# Patient Record
Sex: Female | Born: 1955
Health system: Southern US, Community
[De-identification: ages and names within clinical notes are randomized; demographics above are authoritative.]

## PROBLEM LIST (undated history)

## (undated) DIAGNOSIS — K5090 Crohn's disease, unspecified, without complications: Secondary | ICD-10-CM

## (undated) DIAGNOSIS — D649 Anemia, unspecified: Secondary | ICD-10-CM

## (undated) DIAGNOSIS — C4491 Basal cell carcinoma of skin, unspecified: Secondary | ICD-10-CM

## (undated) DIAGNOSIS — E785 Hyperlipidemia, unspecified: Secondary | ICD-10-CM

## (undated) DIAGNOSIS — I350 Nonrheumatic aortic (valve) stenosis: Secondary | ICD-10-CM

## (undated) DIAGNOSIS — R011 Cardiac murmur, unspecified: Secondary | ICD-10-CM

## (undated) DIAGNOSIS — E7800 Pure hypercholesterolemia, unspecified: Secondary | ICD-10-CM

## (undated) DIAGNOSIS — K529 Noninfective gastroenteritis and colitis, unspecified: Secondary | ICD-10-CM

## (undated) DIAGNOSIS — I10 Essential (primary) hypertension: Secondary | ICD-10-CM

## (undated) HISTORY — PX: TUBAL LIGATION: SHX77

## (undated) HISTORY — DX: Crohn's disease, unspecified, without complications: K50.90

## (undated) HISTORY — DX: Anemia, unspecified: D64.9

## (undated) HISTORY — DX: Cardiac murmur, unspecified: R01.1

## (undated) HISTORY — DX: Hyperlipidemia, unspecified: E78.5

## (undated) HISTORY — DX: Pure hypercholesterolemia, unspecified: E78.00

## (undated) HISTORY — DX: Nonrheumatic aortic (valve) stenosis: I35.0

## (undated) HISTORY — DX: Basal cell carcinoma of skin, unspecified: C44.91

## (undated) HISTORY — PX: OTHER SURGICAL HISTORY: SHX169

## (undated) HISTORY — DX: Noninfective gastroenteritis and colitis, unspecified: K52.9

## (undated) HISTORY — DX: Essential (primary) hypertension: I10

---

## 1981-02-28 ENCOUNTER — Encounter (INDEPENDENT_AMBULATORY_CARE_PROVIDER_SITE_OTHER): Payer: Self-pay | Admitting: *Deleted

## 1984-11-30 ENCOUNTER — Encounter (INDEPENDENT_AMBULATORY_CARE_PROVIDER_SITE_OTHER): Payer: Self-pay | Admitting: *Deleted

## 1986-01-12 ENCOUNTER — Encounter (INDEPENDENT_AMBULATORY_CARE_PROVIDER_SITE_OTHER): Payer: Self-pay | Admitting: *Deleted

## 1987-12-26 ENCOUNTER — Encounter (INDEPENDENT_AMBULATORY_CARE_PROVIDER_SITE_OTHER): Payer: Self-pay | Admitting: *Deleted

## 1990-01-06 ENCOUNTER — Encounter (INDEPENDENT_AMBULATORY_CARE_PROVIDER_SITE_OTHER): Payer: Self-pay | Admitting: *Deleted

## 1990-12-28 ENCOUNTER — Encounter (INDEPENDENT_AMBULATORY_CARE_PROVIDER_SITE_OTHER): Payer: Self-pay | Admitting: *Deleted

## 1992-12-12 ENCOUNTER — Encounter (INDEPENDENT_AMBULATORY_CARE_PROVIDER_SITE_OTHER): Payer: Self-pay | Admitting: *Deleted

## 1996-12-26 ENCOUNTER — Encounter (INDEPENDENT_AMBULATORY_CARE_PROVIDER_SITE_OTHER): Payer: Self-pay | Admitting: *Deleted

## 1998-02-04 ENCOUNTER — Ambulatory Visit: Admission: RE | Admit: 1998-02-04 | Discharge: 1998-02-04 | Payer: Self-pay | Admitting: Cardiology

## 1998-10-24 ENCOUNTER — Other Ambulatory Visit: Admission: RE | Admit: 1998-10-24 | Discharge: 1998-10-24 | Payer: Self-pay | Admitting: Obstetrics and Gynecology

## 1999-06-19 ENCOUNTER — Encounter (INDEPENDENT_AMBULATORY_CARE_PROVIDER_SITE_OTHER): Payer: Self-pay | Admitting: *Deleted

## 1999-06-19 ENCOUNTER — Encounter (INDEPENDENT_AMBULATORY_CARE_PROVIDER_SITE_OTHER): Payer: Self-pay | Admitting: Specialist

## 1999-06-19 ENCOUNTER — Other Ambulatory Visit: Admission: RE | Admit: 1999-06-19 | Discharge: 1999-06-19 | Payer: Self-pay | Admitting: Gastroenterology

## 1999-11-14 ENCOUNTER — Other Ambulatory Visit: Admission: RE | Admit: 1999-11-14 | Discharge: 1999-11-14 | Payer: Self-pay | Admitting: Obstetrics and Gynecology

## 2000-11-17 ENCOUNTER — Other Ambulatory Visit: Admission: RE | Admit: 2000-11-17 | Discharge: 2000-11-17 | Payer: Self-pay | Admitting: Obstetrics and Gynecology

## 2001-06-23 ENCOUNTER — Encounter (INDEPENDENT_AMBULATORY_CARE_PROVIDER_SITE_OTHER): Payer: Self-pay | Admitting: *Deleted

## 2001-09-20 ENCOUNTER — Encounter: Payer: Self-pay | Admitting: Internal Medicine

## 2001-12-01 ENCOUNTER — Other Ambulatory Visit: Admission: RE | Admit: 2001-12-01 | Discharge: 2001-12-01 | Payer: Self-pay | Admitting: Obstetrics and Gynecology

## 2002-01-25 ENCOUNTER — Ambulatory Visit (HOSPITAL_BASED_OUTPATIENT_CLINIC_OR_DEPARTMENT_OTHER): Admission: RE | Admit: 2002-01-25 | Discharge: 2002-01-25 | Payer: Self-pay | Admitting: Plastic Surgery

## 2002-12-07 ENCOUNTER — Other Ambulatory Visit: Admission: RE | Admit: 2002-12-07 | Discharge: 2002-12-07 | Payer: Self-pay | Admitting: Obstetrics and Gynecology

## 2003-05-22 ENCOUNTER — Encounter (INDEPENDENT_AMBULATORY_CARE_PROVIDER_SITE_OTHER): Payer: Self-pay | Admitting: *Deleted

## 2004-04-30 ENCOUNTER — Ambulatory Visit: Payer: Self-pay | Admitting: Gastroenterology

## 2004-05-26 ENCOUNTER — Ambulatory Visit: Payer: Self-pay | Admitting: Cardiology

## 2004-06-11 ENCOUNTER — Ambulatory Visit: Payer: Self-pay

## 2005-05-13 ENCOUNTER — Ambulatory Visit: Payer: Self-pay | Admitting: Gastroenterology

## 2005-06-01 ENCOUNTER — Ambulatory Visit: Payer: Self-pay | Admitting: Cardiology

## 2005-06-25 ENCOUNTER — Encounter (INDEPENDENT_AMBULATORY_CARE_PROVIDER_SITE_OTHER): Payer: Self-pay | Admitting: *Deleted

## 2005-06-25 ENCOUNTER — Encounter: Payer: Self-pay | Admitting: Internal Medicine

## 2005-06-25 ENCOUNTER — Ambulatory Visit: Payer: Self-pay | Admitting: Gastroenterology

## 2006-04-21 ENCOUNTER — Ambulatory Visit: Payer: Self-pay | Admitting: Gastroenterology

## 2006-06-11 ENCOUNTER — Encounter: Payer: Self-pay | Admitting: Cardiology

## 2006-06-11 ENCOUNTER — Ambulatory Visit: Payer: Self-pay

## 2006-06-15 HISTORY — PX: AORTIC VALVE REPLACEMENT: SHX41

## 2006-06-29 ENCOUNTER — Ambulatory Visit: Payer: Self-pay | Admitting: Cardiovascular Disease

## 2006-07-02 ENCOUNTER — Ambulatory Visit: Payer: Self-pay

## 2006-07-27 ENCOUNTER — Ambulatory Visit: Payer: Self-pay | Admitting: Cardiovascular Disease

## 2006-11-23 ENCOUNTER — Encounter: Payer: Self-pay | Admitting: Cardiovascular Disease

## 2006-11-23 ENCOUNTER — Ambulatory Visit: Payer: Self-pay

## 2006-11-23 ENCOUNTER — Ambulatory Visit: Payer: Self-pay | Admitting: Cardiovascular Disease

## 2006-11-23 LAB — CONVERTED CEMR LAB
Basophils Relative: 0 % (ref 0.0–1.0)
CO2: 28 meq/L (ref 19–32)
Chloride: 105 meq/L (ref 96–112)
Creatinine, Ser: 0.8 mg/dL (ref 0.4–1.2)
Eosinophils Relative: 1.6 % (ref 0.0–5.0)
Glucose, Bld: 79 mg/dL (ref 70–99)
HCT: 37.7 % (ref 36.0–46.0)
INR: 1 (ref 0.9–2.0)
Neutrophils Relative %: 77.2 % — ABNORMAL HIGH (ref 43.0–77.0)
RBC: 4.51 M/uL (ref 3.87–5.11)
RDW: 12.9 % (ref 11.5–14.6)
Sodium: 139 meq/L (ref 135–145)
WBC: 11.6 10*3/uL — ABNORMAL HIGH (ref 4.5–10.5)
aPTT: 25.1 s — ABNORMAL LOW (ref 26.5–36.5)

## 2006-11-25 ENCOUNTER — Ambulatory Visit (HOSPITAL_COMMUNITY): Admission: RE | Admit: 2006-11-25 | Discharge: 2006-11-25 | Payer: Self-pay | Admitting: Cardiovascular Disease

## 2006-11-25 ENCOUNTER — Ambulatory Visit: Payer: Self-pay | Admitting: Cardiovascular Disease

## 2006-12-07 ENCOUNTER — Ambulatory Visit: Payer: Self-pay | Admitting: Surgery

## 2006-12-15 ENCOUNTER — Encounter: Payer: Self-pay | Admitting: Surgery

## 2006-12-15 ENCOUNTER — Ambulatory Visit: Payer: Self-pay | Admitting: Vascular Surgery

## 2006-12-16 ENCOUNTER — Ambulatory Visit: Admission: RE | Admit: 2006-12-16 | Discharge: 2006-12-16 | Payer: Self-pay | Admitting: Surgery

## 2006-12-20 ENCOUNTER — Inpatient Hospital Stay (HOSPITAL_COMMUNITY): Admission: AD | Admit: 2006-12-20 | Discharge: 2006-12-25 | Payer: Self-pay | Admitting: Surgery

## 2006-12-20 ENCOUNTER — Ambulatory Visit: Payer: Self-pay | Admitting: Surgery

## 2006-12-20 ENCOUNTER — Encounter: Payer: Self-pay | Admitting: Surgery

## 2006-12-27 ENCOUNTER — Ambulatory Visit: Payer: Self-pay | Admitting: Surgery

## 2007-01-07 ENCOUNTER — Ambulatory Visit: Payer: Self-pay | Admitting: Cardiovascular Disease

## 2007-01-13 ENCOUNTER — Encounter (HOSPITAL_COMMUNITY): Admission: RE | Admit: 2007-01-13 | Discharge: 2007-04-13 | Payer: Self-pay | Admitting: Cardiovascular Disease

## 2007-02-01 ENCOUNTER — Encounter: Payer: Self-pay | Admitting: Cardiovascular Disease

## 2007-02-01 ENCOUNTER — Ambulatory Visit: Payer: Self-pay

## 2007-02-08 ENCOUNTER — Encounter: Admission: RE | Admit: 2007-02-08 | Discharge: 2007-02-08 | Payer: Self-pay | Admitting: Surgery

## 2007-02-08 ENCOUNTER — Ambulatory Visit: Payer: Self-pay | Admitting: Surgery

## 2007-04-14 ENCOUNTER — Encounter (HOSPITAL_COMMUNITY): Admission: RE | Admit: 2007-04-14 | Discharge: 2007-04-22 | Payer: Self-pay | Admitting: Cardiovascular Disease

## 2007-04-19 ENCOUNTER — Ambulatory Visit: Payer: Self-pay | Admitting: Cardiovascular Disease

## 2007-04-28 ENCOUNTER — Ambulatory Visit: Payer: Self-pay | Admitting: Internal Medicine

## 2007-07-14 ENCOUNTER — Ambulatory Visit: Payer: Self-pay | Admitting: Internal Medicine

## 2007-07-28 ENCOUNTER — Ambulatory Visit: Payer: Self-pay | Admitting: Internal Medicine

## 2007-07-28 ENCOUNTER — Encounter: Payer: Self-pay | Admitting: Internal Medicine

## 2007-07-28 LAB — CONVERTED CEMR LAB
Alkaline Phosphatase: 44 units/L (ref 39–117)
BUN: 6 mg/dL (ref 6–23)
Basophils Relative: 1 % (ref 0.0–1.0)
Bilirubin, Direct: 0.1 mg/dL (ref 0.0–0.3)
CO2: 27 meq/L (ref 19–32)
CRP, High Sensitivity: 17 — ABNORMAL HIGH (ref 0.00–5.00)
GFR calc Af Amer: 85 mL/min
Glucose, Bld: 84 mg/dL (ref 70–99)
Hemoglobin: 11.9 g/dL — ABNORMAL LOW (ref 12.0–15.0)
Lymphocytes Relative: 19.7 % (ref 12.0–46.0)
Monocytes Absolute: 0.8 10*3/uL — ABNORMAL HIGH (ref 0.2–0.7)
Monocytes Relative: 11.4 % — ABNORMAL HIGH (ref 3.0–11.0)
Neutro Abs: 4.3 10*3/uL (ref 1.4–7.7)
Potassium: 4.1 meq/L (ref 3.5–5.1)
Sed Rate: 29 mm/hr — ABNORMAL HIGH (ref 0–25)
Total Protein: 6.5 g/dL (ref 6.0–8.3)

## 2007-08-04 DIAGNOSIS — I359 Nonrheumatic aortic valve disorder, unspecified: Secondary | ICD-10-CM | POA: Insufficient documentation

## 2007-08-04 DIAGNOSIS — K5090 Crohn's disease, unspecified, without complications: Secondary | ICD-10-CM | POA: Insufficient documentation

## 2007-08-04 DIAGNOSIS — E785 Hyperlipidemia, unspecified: Secondary | ICD-10-CM | POA: Insufficient documentation

## 2007-08-24 ENCOUNTER — Ambulatory Visit: Payer: Self-pay | Admitting: Internal Medicine

## 2007-10-05 ENCOUNTER — Ambulatory Visit: Payer: Self-pay | Admitting: Internal Medicine

## 2007-11-29 ENCOUNTER — Ambulatory Visit: Payer: Self-pay | Admitting: Internal Medicine

## 2007-11-29 LAB — CONVERTED CEMR LAB
AST: 22 units/L (ref 0–37)
Albumin: 4.1 g/dL (ref 3.5–5.2)
Alkaline Phosphatase: 40 units/L (ref 39–117)
BUN: 10 mg/dL (ref 6–23)
Bilirubin, Direct: 0.1 mg/dL (ref 0.0–0.3)
Chloride: 103 meq/L (ref 96–112)
Eosinophils Relative: 0.7 % (ref 0.0–5.0)
Glucose, Bld: 107 mg/dL — ABNORMAL HIGH (ref 70–99)
Lymphocytes Relative: 26.6 % (ref 12.0–46.0)
Monocytes Relative: 8.2 % (ref 3.0–12.0)
Neutrophils Relative %: 63.6 % (ref 43.0–77.0)
Platelets: 240 10*3/uL (ref 150–400)
Potassium: 4.1 meq/L (ref 3.5–5.1)
Total Protein: 6.9 g/dL (ref 6.0–8.3)
WBC: 8.1 10*3/uL (ref 4.5–10.5)

## 2007-12-07 ENCOUNTER — Ambulatory Visit: Payer: Self-pay | Admitting: Internal Medicine

## 2007-12-28 ENCOUNTER — Ambulatory Visit: Payer: Self-pay | Admitting: Cardiovascular Disease

## 2008-06-04 ENCOUNTER — Ambulatory Visit: Payer: Self-pay | Admitting: Internal Medicine

## 2008-06-04 LAB — CONVERTED CEMR LAB
ALT: 17 units/L (ref 0–35)
AST: 23 units/L (ref 0–37)
Alkaline Phosphatase: 44 units/L (ref 39–117)
BUN: 9 mg/dL (ref 6–23)
Basophils Absolute: 0 10*3/uL (ref 0.0–0.1)
Basophils Relative: 0.5 % (ref 0.0–3.0)
CRP, High Sensitivity: 17 — ABNORMAL HIGH (ref 0.00–5.00)
Calcium: 9.1 mg/dL (ref 8.4–10.5)
Chloride: 104 meq/L (ref 96–112)
Creatinine, Ser: 0.7 mg/dL (ref 0.4–1.2)
Eosinophils Absolute: 0.1 10*3/uL (ref 0.0–0.7)
HCT: 36 % (ref 36.0–46.0)
MCHC: 34.6 g/dL (ref 30.0–36.0)
MCV: 103.5 fL — ABNORMAL HIGH (ref 78.0–100.0)
Monocytes Absolute: 0.9 10*3/uL (ref 0.1–1.0)
Neutrophils Relative %: 54 % (ref 43.0–77.0)
RBC: 3.48 M/uL — ABNORMAL LOW (ref 3.87–5.11)
Sed Rate: 24 mm/hr — ABNORMAL HIGH (ref 0–22)
Total Bilirubin: 0.5 mg/dL (ref 0.3–1.2)

## 2008-06-11 ENCOUNTER — Ambulatory Visit: Payer: Self-pay | Admitting: Internal Medicine

## 2008-06-11 DIAGNOSIS — D649 Anemia, unspecified: Secondary | ICD-10-CM | POA: Insufficient documentation

## 2008-06-11 LAB — CONVERTED CEMR LAB
Folate: >20 ng/mL
Vitamin B-12: 479 pg/mL (ref 211–911)

## 2008-09-08 DIAGNOSIS — Z954 Presence of other heart-valve replacement: Secondary | ICD-10-CM | POA: Insufficient documentation

## 2008-09-08 DIAGNOSIS — I10 Essential (primary) hypertension: Secondary | ICD-10-CM | POA: Insufficient documentation

## 2008-09-08 DIAGNOSIS — K5289 Other specified noninfective gastroenteritis and colitis: Secondary | ICD-10-CM | POA: Insufficient documentation

## 2008-09-08 DIAGNOSIS — E7800 Pure hypercholesterolemia, unspecified: Secondary | ICD-10-CM | POA: Insufficient documentation

## 2009-01-01 ENCOUNTER — Encounter: Payer: Self-pay | Admitting: Cardiovascular Disease

## 2009-01-01 ENCOUNTER — Ambulatory Visit: Payer: Self-pay

## 2009-01-01 ENCOUNTER — Ambulatory Visit: Payer: Self-pay | Admitting: Cardiovascular Disease

## 2009-03-20 ENCOUNTER — Telehealth: Payer: Self-pay | Admitting: Cardiovascular Disease

## 2009-05-20 ENCOUNTER — Telehealth: Payer: Self-pay | Admitting: Internal Medicine

## 2009-06-12 ENCOUNTER — Ambulatory Visit: Payer: Self-pay | Admitting: Internal Medicine

## 2009-06-20 LAB — CONVERTED CEMR LAB
Albumin: 3.7 g/dL (ref 3.5–5.2)
Basophils Relative: 0.6 % (ref 0.0–3.0)
CO2: 25 meq/L (ref 19–32)
CRP, High Sensitivity: 1.4 (ref 0.00–5.00)
Chloride: 107 meq/L (ref 96–112)
Creatinine, Ser: 0.9 mg/dL (ref 0.4–1.2)
Eosinophils Relative: 1.5 % (ref 0.0–5.0)
Lymphocytes Relative: 23.4 % (ref 12.0–46.0)
Monocytes Relative: 8.5 % (ref 3.0–12.0)
Neutrophils Relative %: 66 % (ref 43.0–77.0)
Platelets: 189 10*3/uL (ref 150.0–400.0)
RBC: 3.74 M/uL — ABNORMAL LOW (ref 3.87–5.11)
Sodium: 139 meq/L (ref 135–145)
Total Protein: 6.5 g/dL (ref 6.0–8.3)
WBC: 8 10*3/uL (ref 4.5–10.5)

## 2009-07-03 ENCOUNTER — Ambulatory Visit: Payer: Self-pay | Admitting: Internal Medicine

## 2009-07-17 ENCOUNTER — Encounter (INDEPENDENT_AMBULATORY_CARE_PROVIDER_SITE_OTHER): Payer: Self-pay | Admitting: *Deleted

## 2009-11-25 ENCOUNTER — Telehealth: Payer: Self-pay | Admitting: Cardiovascular Disease

## 2009-12-06 ENCOUNTER — Encounter (INDEPENDENT_AMBULATORY_CARE_PROVIDER_SITE_OTHER): Payer: Self-pay | Admitting: *Deleted

## 2009-12-09 ENCOUNTER — Ambulatory Visit: Payer: Self-pay | Admitting: Internal Medicine

## 2009-12-19 ENCOUNTER — Ambulatory Visit: Payer: Self-pay | Admitting: Internal Medicine

## 2009-12-20 ENCOUNTER — Encounter: Payer: Self-pay | Admitting: Internal Medicine

## 2010-01-01 ENCOUNTER — Ambulatory Visit: Payer: Self-pay | Admitting: Cardiovascular Disease

## 2010-07-06 ENCOUNTER — Encounter: Payer: Self-pay | Admitting: Surgery

## 2010-07-15 NOTE — Procedures (Signed)
Summary: Colonoscopy- [name], MD   Colonoscopy  Procedure date:  06/25/2005  Findings:      Location:  [facility].  Crohn's Disease  Procedures Next Due Date:    Colonoscopy: 06/2007 Patient Name: Brittany [name], Brittany [name]. MRN:  Procedure Procedures: Colonoscopy CPT: [phone].  Personnel: Endoscopist: Clarene Reamer, MD.  Exam Location: Exam performed in Outpatient Clinic. Outpatient  Patient Consent: Procedure, Alternatives, Risks and Benefits discussed, consent obtained, from patient. Consent was obtained by the RN.  Indications  Surveillance of: Crohn's Disease.  History  Current Medications: Patient is not currently taking Coumadin.  Pre-Exam Physical: Performed Sep 20, 2001. Entire physical exam was normal. Cardio- pulmonary exam, Rectal exam, HEENT exam , Abdominal exam, Extremity exam, Neurological exam, Mental status exam WNL.  Comments: Pt. history reviewed/updated, physical exam performed prior to initiation of sedation? Exam Exam: Extent of exam reached: Cecum, extent intended: Cecum.  The cecum was identified by appendiceal orifice and IC valve. Colon retroflexion performed. Images taken. ASA Classification: II. Tolerance: good.  Monitoring: Pulse and BP monitoring, Oximetry used. Supplemental O2 given.  Colon Prep Prep results: good.  Sedation Meds: Patient assessed and found to be appropriate for moderate (conscious) sedation. Fentanyl 125 mcg. given IV. Versed 15 given IV.  Findings - MUCOSAL ABNORMALITY: Ileum to Rectum. established. Granularity present, Activity level inactive, Endoscopic Extent of Disease: Pancolitis. Biopsy/Mucosal Abn. taken. ICD9: Crohn's Disease: 555.9. Comments: pt. with minnimal changes ; multiple bx,s obtained from r , tr. and l colon.   Assessment Abnormal examination, see findings above.  Diagnoses: 555.9: Crohn's Disease.   Events  Unplanned Interventions: No intervention was required.    Unplanned Events: There were no complications. Plans Medication Plan: Await pathology. Continue current medications.  Patient Education: Patient given standard instructions for: Crohn's. Colitis. Yearly hemoccult testing recommended. Patient instructed to get routine colonoscopy every 2 years.  Disposition: After procedure patient sent to recovery. After recovery patient sent home.   This report was created from the original endoscopy report, which was reviewed and signed by the above listed endoscopist.   cc: Burnard Bunting, MD

## 2010-07-15 NOTE — Letter (Signed)
Summary: Colonoscopy Letter  [facility]  [place], [place] [address]   Phone: [phone]  Fax: [phone]      July 17, 2009 MRN: [id number]   [place], [place]  [address]   Dear Ms. Boden,   According to your medical record, it is time for you to schedule a Colonoscopy. The American Cancer Society recommends this procedure as a method to detect early colon cancer. Patients with a family history of colon cancer, or a personal history of colon polyps or inflammatory bowel disease are at increased risk.  This letter has beeen generated based on the recommendations made at the time of your procedure. If you feel that in your particular situation this may no longer apply, please contact our office.  Please call our office at [phone] to schedule this appointment or to update your records at your earliest convenience.  Thank you for cooperating with [place] to provide you with the very best care possible.   Sincerely,  Docia Chuck. Henrene Pastor, M.D.  [facility] [phone]

## 2010-07-15 NOTE — Procedures (Signed)
Summary: [employer] officer, historic buildings   Imported By: Phillis Knack 07/05/2009 09:22:30  _____________________________________________________________________  External Attachment:    Type:   Image     Comment:   External Document

## 2010-07-15 NOTE — Procedures (Signed)
Summary: Colonoscopy/[facility]  Colonoscopy/[facility]   Imported By: Phillis Knack 07/05/2009 09:11:20  _____________________________________________________________________  External Attachment:    Type:   Image     Comment:   External Document

## 2010-07-15 NOTE — Procedures (Signed)
Summary: Melina Copa, MD   Colonoscopy  Procedure date:  05/22/2003  Findings:      Location:  [facility].  Results: Colitis.         Procedures Next Due Date:    Colonoscopy: 05/2005 Patient Name: Brittany [name], Brittany [name]. MRN:  Procedure Procedures: Colonoscopy CPT: [phone].  Personnel: Endoscopist: Clarene Reamer, MD.  Exam Location: Exam performed in Outpatient Clinic. Outpatient  Patient Consent: Procedure, Alternatives, Risks and Benefits discussed, consent obtained, from patient. Consent was obtained by the RN.  Indications  Surveillance of: Crohn's Disease.  History  Current Medications: Patient is not currently taking Coumadin.  Pre-Exam Physical: Performed Sep 20, 2001. Entire physical exam was normal. Cardio- pulmonary exam, Rectal exam, HEENT exam , Abdominal exam, Extremity exam, Neurological exam, Mental status exam WNL.  Exam Exam: Extent of exam reached: Cecum, extent intended: Cecum.  The cecum was identified by appendiceal orifice and IC valve. Colon retroflexion performed. Images were not taken. ASA Classification: II. Tolerance: good.  Monitoring: Pulse and BP monitoring, Oximetry used. Supplemental O2 given.  Colon Prep Prep results: good.  Sedation Meds: Patient assessed and found to be appropriate for moderate (conscious) sedation. Fentanyl 100 mcg. given IV. Versed 10 mg. given IV.  Findings CROHN'S: Ascending Colon. Activity level inactive, Endoscopic Extent of Disease: Pancolitis. Comments: patchy colitis very mild bx,s r, tr. l .   Assessment Abnormal examination, see findings above.  Events  Unplanned Interventions: No intervention was required.  Unplanned Events: There were no complications. Plans Medication Plan: Await pathology. Continue current medications.  Patient Education: Patient given standard instructions for: Crohn's. Yearly hemoccult testing recommended. Patient instructed to get routine  colonoscopy every 2 years.  Disposition: After procedure patient sent to recovery. After recovery patient sent home.   This report was created from the original endoscopy report, which was reviewed and signed by the above listed endoscopist.   cc:  Burnard Bunting, MD

## 2010-07-15 NOTE — Procedures (Signed)
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

## 2010-07-15 NOTE — Procedures (Signed)
Summary: Colonoscopy  Patient: [name] Note: All result statuses are Final unless otherwise noted.  Tests: (1) Colonoscopy (COL)   COL Colonoscopy           [facility]     [place], [place]  [address]           COLONOSCOPY PROCEDURE REPORT           PATIENT:  Brittany, [name]  MR#:  [id number]     BIRTHDATE:  [birth year], 17 yrs. old  GENDER:  female     ENDOSCOPIST:  Docia Chuck. Geri Seminole, MD     REF. BY:  Surveillance Program Recall     PROCEDURE DATE:  12/19/2009     PROCEDURE:  Colonoscopy with biopsies     ASA CLASS:  Class II     INDICATIONS:  surveillance and high-risk screening, Crohn's     disease (longstanding Crohn's colitis - last colonoscopy 07-2007)     MEDICATIONS:   Fentanyl 100 mcg IV, Versed 10 mg IV           DESCRIPTION OF PROCEDURE:   After the risks benefits and     alternatives of the procedure were thoroughly explained, informed     consent was obtained.  Digital rectal exam was performed and     revealed no abnormalities.   The LB [id number] endoscope     was introduced through the anus and advanced to the cecum, which     was identified by both the appendix and ileocecal valve, without     limitations.Time to cecum = 7:44 min.  The quality of the prep was     excellent, using MoviPrep.  The instrument was then slowly     withdrawn (time = 15:30 min)as the colon was fully examined.     <<PROCEDUREIMAGES>>           FINDINGS:  The terminal ileum was intubated and appeared normal.     Patchy Colitis was found in the colon, namely the ascending,     portions of the transverse, and splenic flexure. 4Q bx q10cm taken     in 4 seperate containers (n=32).  No polyps or cancers were seen.     Retroflexed views in the rectum revealed no abnormalities.    The     scope was then withdrawn from the patient and the procedure     completed.           COMPLICATIONS:  None     ENDOSCOPIC IMPRESSION:     1) Normal terminal  ileum     2) Patchy Crohn's Colitis as described     3) No polyps or cancers           RECOMMENDATIONS:     1) Follow up colonoscopy in 2 years     2) Await biopsy results     3) Continue Medication           ______________________________     Docia Chuck. Geri Seminole, MD           CC:  Burnard Bunting, MD; The Patient           n.     eSIGNED:   Docia Chuck. Geri Seminole at 12/19/2009 01:37 PM           Barney Drain, [id number]  Note: An exclamation mark (!) indicates a result that was not dispersed into  the flowsheet. Document Creation Date: 12/19/2009 1:38 PM _______________________________________________________________________  (1) Order result status: Final Collection or observation date-time: 12/19/2009 13:22 Requested date-time:  Receipt date-time:  Reported date-time:  Referring Physician:   Ordering Physician: Lavena Bullion [phone]) Specimen Source:  Source: Tawanna Cooler Order Number: [id number] Lab site:   Appended Document: Colonoscopy recall 2 yrs     Procedures Next Due Date:    Colonoscopy: 12/2011

## 2010-07-15 NOTE — Miscellaneous (Signed)
Summary: [facility]  Clinical Lists Changes  Medications: Added new medication of MOVIPREP 100 GM  SOLR (PEG-KCL-NACL-NASULF-NA ASC-C) As per prep instructions. - Signed Rx of MOVIPREP 100 GM  SOLR (PEG-KCL-NACL-NASULF-NA ASC-C) As per prep instructions.;  #1 x 0;  Signed;  Entered by: Ulice Dash RN;  Authorized by: Irene Shipper MD;  Method used: Electronically to [facility]. [phone]*, [address], [place], [place]  [address], Ph: [phone] or [phone], Fax: [phone] Observations: Added new observation of NKA: T (12/09/2009 8:19)    Prescriptions: MOVIPREP 100 GM  SOLR (PEG-KCL-NACL-NASULF-NA ASC-C) As per prep instructions.  #1 x 0   Entered by:   Ulice Dash RN   Authorized by:   Irene Shipper MD   Signed by:   Ulice Dash RN on 12/09/2009   Method used:   Electronically to        [facility] [phone]* (retail)       [address]       [place], [place]  [address]       Ph: [phone] or [phone]       Fax: [phone]   RxID:   [id number]

## 2010-07-15 NOTE — Procedures (Signed)
Summary: Colonoscopy/[facility]  Colonoscopy/[facility]   Imported By: Phillis Knack 07/05/2009 09:14:53  _____________________________________________________________________  External Attachment:    Type:   Image     Comment:   External Document

## 2010-07-15 NOTE — Procedures (Signed)
Summary: Colonoscopy/[facility]  Colonoscopy/[facility]   Imported By: Phillis Knack 07/05/2009 09:13:48  _____________________________________________________________________  External Attachment:    Type:   Image     Comment:   External Document

## 2010-07-15 NOTE — Letter (Signed)
Summary: [facility] Instructions  [facility]  [place], [place] [address]   Phone: [phone]  Fax: [phone]       MARKELLA DAO    1956-02-02    MRN: [id number]        Procedure Day /Date: Thursday 12-19-09     Arrival Time: 10:30 a.m.     Procedure Time: 11:30 a.m.     Location of Procedure:                    _x_  [place] (4th Floor)                        [place]   Starting 5 days prior to your procedure  Sat. 12-14-09 do not eat nuts, seeds, popcorn, corn, beans, peas,  salads, or any raw vegetables.  Do not take any fiber supplements (e.g. Metamucil, Citrucel, and Benefiber).  THE DAY BEFORE YOUR PROCEDURE         DATE:  12-18-09   DAY:  Wednesday  1.  Drink clear liquids the entire day-NO SOLID FOOD  2.  Do not drink anything colored red or purple.  Avoid juices with pulp.  No orange juice.  3.  Drink at least 64 oz. (8 glasses) of fluid/clear liquids during the day to prevent dehydration and help the prep work efficiently.  CLEAR LIQUIDS INCLUDE: Water Jello Ice Popsicles Tea (sugar ok, no milk/cream) Powdered fruit flavored drinks Coffee (sugar ok, no milk/cream) Gatorade Juice: apple, white grape, white cranberry  Lemonade Clear bullion, consomm, broth Carbonated beverages (any kind) Strained chicken noodle soup Hard Candy                             4.  In the morning, mix first dose of MoviPrep solution:    Empty 1 Pouch A and 1 Pouch B into the disposable container    Add lukewarm drinking water to the top line of the container. Mix to dissolve    Refrigerate (mixed solution should be used within 24 hrs)  5.  Begin drinking the prep at 5:00 p.m. The MoviPrep container is divided by 4 marks.   Every 15 minutes drink the solution down to the next mark (approximately 8 oz) until the full liter is complete.   6.  Follow completed prep with 16 oz of clear liquid of your  choice (Nothing red or purple).  Continue to drink clear liquids until bedtime.  7.  Before going to bed, mix second dose of MoviPrep solution:    Empty 1 Pouch A and 1 Pouch B into the disposable container    Add lukewarm drinking water to the top line of the container. Mix to dissolve    Refrigerate  THE DAY OF YOUR PROCEDURE      DATE:  12-19-09  DAY:  Thursday  Beginning at  6:30 a.m. (5 hours before procedure):         1. Every 15 minutes, drink the solution down to the next mark (approx 8 oz) until the full liter is complete.  2. Follow completed prep with 16 oz. of clear liquid of your choice.    3. You may drink clear liquids until  9:30 a.m.  (2 HOURS BEFORE PROCEDURE).   MEDICATION INSTRUCTIONS  Unless otherwise instructed, you should take regular prescription medications with a small sip of  water   as early as possible the morning of your procedure.           OTHER INSTRUCTIONS  You will need a responsible adult at least 54 years of age to accompany you and drive you home.   This person must remain in the waiting room during your procedure.  Wear loose fitting clothing that is easily removed.  Leave jewelry and other valuables at home.  However, you may wish to bring a book to read or  an iPod/MP3 player to listen to music as you wait for your procedure to start.  Remove all body piercing jewelry and leave at home.  Total time from sign-in until discharge is approximately 2-3 hours.  You should go home directly after your procedure and rest.  You can resume normal activities the  day after your procedure.  The day of your procedure you should not:   Drive   Make legal decisions   Operate machinery   Drink alcohol   Return to work  You will receive specific instructions about eating, activities and medications before you leave.    The above instructions have been reviewed and explained to me by   Ulice Dash RN  December 09, 2009 9:01 AM     I  fully understand and can verbalize these instructions _____________________________ Date _________

## 2010-07-15 NOTE — Assessment & Plan Note (Signed)
Summary: Crohn's colitis annual followup   History of Present Illness Visit Type: Follow-up Visit Primary GI MD: Scarlette Shorts MD Primary Provider: Burnard Bunting, MD Chief Complaint: Yearly F/U w/labs, patient states that she is not having any problems History of Present Illness:   55 year old white female with aortic stenosis status post aortic valve replacement, hyperlipidemia, hypertension, and Crohn's colitis. She presents today for ongoing management of the latter. The patient was last evaluated December 2009. Since that time she is continued on Azulfidine 2 g b.i.d. She denies any active GI symptoms. She was told followup at this time. Recent laboratories, obtained June 12, 2009 reveal a normal hemoglobin of 12.8. MCV elevated at 102.8. Normal liver function tests. Normal electrolytes. Elevated glucose at 124. Last colonoscopy several years ago revealed active inflammation. She is due for followup surveillance colonoscopy at this time. Her other chronic medical problems are stable.   GI Review of Systems      Denies abdominal pain, acid reflux, belching, bloating, chest pain, dysphagia with liquids, dysphagia with solids, heartburn, loss of appetite, nausea, vomiting, vomiting blood, weight loss, and  weight gain.        Denies anal fissure, black tarry stools, change in bowel habit, constipation, diarrhea, diverticulosis, fecal incontinence, heme positive stool, hemorrhoids, irritable bowel syndrome, jaundice, light color stool, liver problems, rectal bleeding, and  rectal pain.    Current Medications (verified): 1)  Metoprolol Tartrate 25 Mg  Tabs (Metoprolol Tartrate) .Marland Kitchen.. 1 Tablet By Mouth Two Times A Day 2)  Crestor 10 Mg  Tabs (Rosuvastatin Calcium) .Marland Kitchen.. 1 Tablet Once Daily 3)  Calcium .Marland Kitchen.. 1 Tablet Once Daily 4)  Aspirin Ec 325 Mg  Tbec (Aspirin) .Marland Kitchen.. 1 Tablet Once Daily 5)  Centrum   Tabs (Multiple Vitamins-Minerals) .Marland Kitchen.. 1 Tablet Once Daily 6)  Sulfasalazine 500 Mg Tabs  (Sulfasalazine) .... Take 4 Tablets Two Times A Day 7)  Caltrate 600+d Plus 600-400 Mg-Unit Chew (Calcium Carbonate-Vit D-Min) .... Take 2 Tablets Daily  Allergies (verified): No Known Drug Allergies  Past History:  Past Medical History: Reviewed history from 09/08/2008 and no changes required. HYPERCHOLESTEROLEMIA  IIA (ICD-272.0) COLITIS (ICD-558.9) HYPERTENSION, UNSPECIFIED (ICD-401.9) ANEMIA-UNSPECIFIED (ICD-285.9) AORTIC STENOSIS (ICD-424.1) CROHN'S DISEASE (ICD-555.9) DYSLIPIDEMIA (ICD-272.4)  Past Surgical History: Reviewed history from 08/04/2007 and no changes required. Aortic Valve Replacement:  tubal ligation  Family History: Reviewed history from 12/07/2007 and no changes required. Family History of Breast Cancer:Paternal Aunts No FH of Colon Cancer: Family History of Prostate Cancer:Father Family History of Colitis/Crohn's:Sister Family History of Diabetes: Paternal Grandfather Family History of Heart Disease: Paternal Grandfather  Social History: Reviewed history from 12/07/2007 and no changes required. 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  Alcohol Use - yes 3 per week Daily Caffeine YWV:3-7 times weekly Illicit Drug Use - no Patient gets regular exercise.  Review of Systems       non-GI review of systems is entirely negative  Vital Signs:  Patient profile:   55 year old female Height:      64 inches Weight:      142.13 pounds BMI:     24.48 Pulse rate:   72 / minute Pulse rhythm:   regular BP sitting:   124 / 86  (left arm) Cuff size:   regular  Vitals Entered By: June McMurray Halfway Deborra Medina) (July 03, 2009 3:39 PM)  Physical Exam  General:  Well developed, well nourished, no acute distress. Head:  Normocephalic and atraumatic. Eyes:  PERRLA, no icterus. Ears:  Normal auditory  acuity. Nose:  No deformity, discharge,  or lesions. Mouth:  No deformity or lesions, dentition normal. Neck:  Supple; no masses or thyromegaly. Chest Wall:   Symmetrical;  no deformities or tenderness. Lungs:  Clear throughout to auscultation. Heart:  Regular rate and rhythm; no murmurs, rubs,  or bruits. Abdomen:  Soft, nontender and nondistended. No masses, hepatosplenomegaly or hernias noted. Normal bowel sounds. Rectal:  deferred Msk:  Symmetrical with no gross deformities. Normal posture. Pulses:  Normal pulses noted. Extremities:  No clubbing, cyanosis, edema or deformities noted. Neurologic:  Alert and  oriented x4;   Skin:  Intact without significant lesions or rashes. Psych:  Alert and cooperative. Normal mood and affect.   Impression & Recommendations:  Problem # 1:  CROHN'S DISEASE (ICD-555.9) Crohn's colitis. Last surveillance colonoscopy in 2009. Clinically asymptomatic on Azulfidine. Recent blood work unremarkable. Due for surveillance colonoscopy.  Plan: #1. Continue Azulfidine 2 g b.i.d. A prescription with multiple refills have been submitted #2. Colonoscopy with biopsies. The nature of the procedure as well as risks, benefits, and alternatives were reviewed. She understood and agreed to proceed. The patient needs to attend to her husband's health needs. He will contact the office in the next few months to set up her colonoscopy. She can see a pre-visit nurse directly. Movi prep will be prescribed. #3. After colonoscopy, routine office followup would be planned in about one year  Patient Instructions: 1)  copy: Dr. Burnard Bunting

## 2010-07-15 NOTE — Procedures (Signed)
Summary: Colonoscopy/[facility]  Colonoscopy/[facility]   Imported By: Phillis Knack 07/05/2009 09:16:34  _____________________________________________________________________  External Attachment:    Type:   Image     Comment:   External Document

## 2010-07-15 NOTE — Assessment & Plan Note (Signed)
Summary: F1Y/DM   Primary Provider:  Burnard Bunting, MD  CC:  no complaints.  History of Present Illness: Brittany [name] is seen today in followup for her aortic valve replacement.  By her relatively young age he chose a tissue valve.  Her surgery was done in 2008. Echo 7/10 showed a mean gradient of 18 mmHg and peak of 32 mmHg.  There was no periprosthetic leak.  She is active with no SOB, palpitatoins chest pain or edema.  Has chrones disease followed by Dr Henrene Pastor.  Did not get SBE prophylaxis with las colonoscopy.  Will have to review AHA 2008 guidlines given her tissue AVR.    Current Problems (verified): 1)  Aortic Valve Replacement, Hx of  (ICD-V43.3) 2)  Hypercholesterolemia Iia  (ICD-272.0) 3)  Colitis  (ICD-558.9) 4)  Hypertension, Unspecified  (ICD-401.9) 5)  Anemia-unspecified  (ICD-285.9) 6)  Aortic Stenosis  (ICD-424.1) 7)  Crohn's Disease  (ICD-555.9) 8)  Dyslipidemia  (ICD-272.4)  Current Medications (verified): 1)  Metoprolol Tartrate 25 Mg  Tabs (Metoprolol Tartrate) .Marland Kitchen.. 1 Tablet By Mouth Two Times A Day 2)  Crestor 10 Mg  Tabs (Rosuvastatin Calcium) .Marland Kitchen.. 1 Tablet Once Daily 3)  Calcium .Marland Kitchen.. 1 Tablet Once Daily 4)  Aspirin Ec 325 Mg  Tbec (Aspirin) .Marland Kitchen.. 1 Tablet Once Daily 5)  Centrum   Tabs (Multiple Vitamins-Minerals) .Marland Kitchen.. 1 Tablet Once Daily 6)  Sulfasalazine 500 Mg Tabs (Sulfasalazine) .... Take 4 Tablets Two Times A Day 7)  Caltrate 600+d Plus 600-400 Mg-Unit Chew (Calcium Carbonate-Vit D-Min) .... Take 2 Tablets Daily  Allergies (verified): No Known Drug Allergies  Past History:  Past Medical History: Last updated: 09/08/2008 HYPERCHOLESTEROLEMIA  IIA (ICD-272.0) COLITIS (ICD-558.9) HYPERTENSION, UNSPECIFIED (ICD-401.9) ANEMIA-UNSPECIFIED (ICD-285.9) AORTIC STENOSIS (ICD-424.1) CROHN'S DISEASE (ICD-555.9) DYSLIPIDEMIA (ICD-272.4)  Past Surgical History: Last updated: 08/04/2007 Aortic Valve Replacement:  tubal ligation  Family History: Last updated:  12/07/2007 Family History of Breast Cancer:Paternal Aunts No FH of Colon Cancer: Family History of Prostate Cancer:Father Family History of Colitis/Crohn's:Sister Family History of Diabetes: Paternal Grandfather Family History of Heart Disease: Paternal Grandfather  Social History: Last updated: 12/07/2007 Occupation: Engineer, 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  Alcohol Use - yes 3 per week Daily Caffeine KVQ:2-5 times weekly Illicit Drug Use - no Patient gets regular exercise.  Review of Systems       Denies fever, malais, weight loss, blurry vision, decreased visual acuity, cough, sputum, SOB, hemoptysis, pleuritic pain, palpitaitons, heartburn, abdominal pain, melena, lower extremity edema, claudication, or rash.   Vital Signs:  Patient profile:   55 year old female Height:      64 inches Weight:      144 pounds Pulse rate:   80 / minute Pulse rhythm:   regular BP sitting:   104 / 70  (left arm) Cuff size:   regular  Vitals Entered By: Devra Dopp, LPN (January 01, 9562 8:75 PM)  Physical Exam  General:  Affect appropriate Healthy:  appears stated age 75: normal Neck supple with no adenopathy JVP normal no bruits no thyromegaly Lungs clear with no wheezing and good diaphragmatic motion Heart:  I4/P3 systolic  murmur,rub, gallop or click PMI normal Abdomen: benighn, BS positve, no tenderness, no AAA no bruit.  No HSM or HJR Distal pulses intact with no bruits No edema Neuro non-focal Skin warm and dry    Impression & Recommendations:  Problem # 1:  AORTIC VALVE REPLACEMENT, HX OF (ICD-V43.3) Functioning normally.  Will have to research SBE indications for GI procedures.  Discussed with Dr Aundra Dubin and Ron Parker and they are not sure  either.  Quick review of AHA guidelines indicate not needed for colonoscopy only dental procedures  Problem # 2:  HYPERCHOLESTEROLEMIA  IIA (ICD-272.0) F/U labs with primary in 6 months Her updated medication list for this problem includes:     Crestor 10 Mg Tabs (Rosuvastatin calcium) .Marland Kitchen... 1 tablet once daily  CRP: 1.40 mg/L (06/12/2009)     Problem # 3:  HYPERTENSION, UNSPECIFIED (ICD-401.9) Well controlled Her updated medication list for this problem includes:    Metoprolol Tartrate 25 Mg Tabs (Metoprolol tartrate) .Marland Kitchen... 1 tablet by mouth two times a day    Aspirin Ec 325 Mg Tbec (Aspirin) .Marland Kitchen... 1 tablet once daily  Problem # 4:  COLITIS (ICD-558.9) F/U Dr Henrene Pastor.  NO abdominal pain or bloody stool  Patient Instructions: 1)  Your physician recommends that you schedule a follow-up appointment in: [place] 2)  Your physician recommends that you continue on your current medications as directed. Please refer to the Current Medication list given to you today.

## 2010-07-15 NOTE — Progress Notes (Signed)
Summary: dose pt need echo  Phone Note Call from Patient Call back at Work Phone [phone]   Caller: Patient Reason for Call: Talk to Nurse, Talk to Doctor Summary of Call: pt was wndering did she need to schedule an echo for this year if so please call and le ther know at work Initial call taken by: Shelda Pal,  November 25, 2009 9:20 AM  Follow-up for Phone Call        spoke with pt, she has had echo's the last two years for her valve. she is not scheduled this year and wanted to make sure she does not need one. she is having no problems. will discuss with dr Nelida Meuse, RN  November 25, 2009 12:46 PM  discussed with dr Johnsie Cancel, pt does not need echo prior to appt. pt aware Fredia Beets, RN  November 25, 2009 5:22 PM

## 2010-07-15 NOTE — Letter (Signed)
Summary: Patient Notice- Colon Biospy Results  [facility]  [address] [place], [place] [address]   Phone: [phone]  Fax: [phone]        December 20, 2009 MRN: [id number]    Brittany [name] [address] [place], [place]  [address]    Dear [name],  I am pleased to inform you that the biopsies taken during your recent colonoscopy did not show any evidence of dysplasia upon pathologic examination. There were changes of mild patchy colitis, consistent with what was observed endoscopically.    Additional information/recommendations:   __No further action is needed at this time.  Please follow-up with      your primary care physician for your other healthcare needs.   __You should have a repeat colonoscopy examination for this problem           in 2 years.    Please call us if you are having persistent problems or have questions about your condition that have not been fully answered at this time.  Sincerely,  Irene Shipper MD   This letter has been electronically signed by your physician.  Appended Document: Patient Notice- Colon Biospy Results letter mailed .

## 2010-07-15 NOTE — Procedures (Signed)
Summary: Colonoscopy/[facility]  Colonoscopy/[facility]   Imported By: Phillis Knack 07/05/2009 09:19:59  _____________________________________________________________________  External Attachment:    Type:   Image     Comment:   External Document

## 2010-07-15 NOTE — Procedures (Signed)
Summary: Colonoscopy/[facility]  Colonoscopy/[facility]   Imported By: Phillis Knack 07/05/2009 09:18:32  _____________________________________________________________________  External Attachment:    Type:   Image     Comment:   External Document

## 2010-07-15 NOTE — Procedures (Signed)
Summary: Melina Copa, MD   Colonoscopy  Procedure date:  06/23/2001  Findings:      Location:  [place].  Results: Colitis.         Procedures Next Due Date:    Colonoscopy: 06/2003  Colonoscopy  Procedure date:  06/23/2001  Findings:      Location:  [place].  Results: Colitis.         Procedures Next Due Date:    Colonoscopy: 06/2003 Patient Name: Brittany [name], Brittany [name]. MRN:  Procedure Procedures: Colonoscopy CPT: [phone].    with multiple biopsies. CPT: [phone].  Personnel: Endoscopist: Clarene Reamer, MD.  Referred By: Nanine Means [name], MD.  Exam Location: Exam performed in Outpatient Clinic. Outpatient  Patient Consent: Procedure, Alternatives, Risks and Benefits discussed, consent obtained, from patient.  Indications  Surveillance of: Ulcerative Colitis.  History  Pre-Exam Physical: Cardio-pulmonary exam, Rectal exam, HEENT exam , Abdominal exam, Extremity exam, Mental status exam WNL.  Exam Exam: Extent of exam reached: Cecum, extent intended: Cecum.  The cecum was identified by appendiceal orifice and IC valve. ASA Classification: II. Tolerance: good.  Monitoring: Pulse and BP monitoring, Oximetry used. Supplemental O2 given.  Colon Prep Prep results: fair, exam compromised.  Sedation Meds: Patient assessed and found to be appropriate for moderate (conscious) sedation. Fentanyl 100 mcg. given IV. Versed 12 given IV.  Findings MUCOSAL ABNORMALITY: Transverse Colon. Biopsy/Mucosal Abn. taken. Comments: 27m thickened mucosa bx .  - MUCOSAL ABNORMALITY: Cecum to Sigmoid Colon. taken. ICD9: Colitis, Pancolitic, Universal: 556.4. Comments: colitis in remission.  - HEMORRHOIDS: Internal and External. Size: Grade I.   Assessment Abnormal examination, see findings above.  Diagnoses: 556.4: Colitis, Pancolitic, Universal. in remission.   Events  Unplanned Interventions: No intervention was  required.  Unplanned Events: There were no complications. Plans Medication Plan: Await pathology. Continue current medications.  Patient Education: Patient given standard instructions for: Colitis. Hemorrhoids. Yearly hemoccult testing recommended. Patient instructed to get routine colonoscopy every 2 years.  Disposition: After procedure patient sent to recovery. After recovery patient sent home.   This report was created from the original endoscopy report, which was reviewed and signed by the above listed endoscopist.   cc:  RBurnard Bunting MD

## 2010-07-18 NOTE — Procedures (Signed)
Summary: Colonoscopy/[facility]  Colonoscopy/[facility]   Imported By: Phillis Knack 07/05/2009 09:21:12  _____________________________________________________________________  External Attachment:    Type:   Image     Comment:   External Document

## 2010-08-11 ENCOUNTER — Encounter: Payer: Self-pay | Admitting: Internal Medicine

## 2010-08-11 ENCOUNTER — Ambulatory Visit (INDEPENDENT_AMBULATORY_CARE_PROVIDER_SITE_OTHER): Payer: BC Managed Care – PPO | Admitting: Internal Medicine

## 2010-08-11 DIAGNOSIS — K5090 Crohn's disease, unspecified, without complications: Secondary | ICD-10-CM

## 2010-08-21 NOTE — Assessment & Plan Note (Signed)
Summary:  Crohn's colitis -  annual followup   History of Present Illness Visit Type: Follow-up Consult Primary GI MD: Scarlette Shorts MD Primary Provider: Burnard Bunting, MD Chief Complaint: Patient here for her yearly follow up on her crohns, she denies any GI complaints.  History of Present Illness:    55 year old female with aortic stenosis status post aortic valve replacement, hyperlipidemia, hypertension, in chronic Crohn's colitis. She presents today for routine followup. She has been maintained on Azulfidine 2 g twice a day. Her last surveillance colonoscopy was performed July 7 2011th the ileum was normal. The colon revealed patchy Crohn's colitis. No polyps or cancers. Followup in 2 years recommended. She has no symptoms to report such as change in bowel habits, abdominal pain, or bleeding with mucus. She does report fleeting rectal pain occurs exclusively at night and last less than 5 minutes. This awakens her. No such problem since last fall. She is concerned however. Review of outside laboratories from Dr. Jacquiline Doe office dated June 24, 2010 show a normal hemoglobin of 13.4 , normal comprehensive metabolic panel including liver tests. Negative urinalysis.   GI Review of Systems      Denies abdominal pain, acid reflux, belching, bloating, chest pain, dysphagia with liquids, dysphagia with solids, heartburn, loss of appetite, nausea, vomiting, vomiting blood, weight loss, and  weight gain.      Reports rectal pain.     Denies anal fissure, black tarry stools, change in bowel habit, constipation, diarrhea, diverticulosis, fecal incontinence, heme positive stool, hemorrhoids, irritable bowel syndrome, jaundice, light color stool, liver problems, and  rectal bleeding.    Current Medications (verified): 1)  Metoprolol Tartrate 25 Mg  Tabs (Metoprolol Tartrate) .Marland Kitchen.. 1 Tablet By Mouth Two Times A Day 2)  Crestor 10 Mg  Tabs (Rosuvastatin Calcium) .Marland Kitchen.. 1 Tablet Once Daily 3)  Calcium Chews  .Marland Kitchen.. 1 Tablet By Mouth Two Times A Day 4)  Aspirin Ec 325 Mg  Tbec (Aspirin) .Marland Kitchen.. 1 Tablet Once Daily 5)  Centrum   Tabs (Multiple Vitamins-Minerals) .Marland Kitchen.. 1 Tablet Once Daily 6)  Sulfasalazine 500 Mg Tabs (Sulfasalazine) .... Take 4 Tablets Two Times A Day  Allergies (verified): No Known Drug Allergies  Past History:  Past Medical History: Reviewed history from 09/08/2008 and no changes required. HYPERCHOLESTEROLEMIA  IIA (ICD-272.0) COLITIS (ICD-558.9) HYPERTENSION, UNSPECIFIED (ICD-401.9) ANEMIA-UNSPECIFIED (ICD-285.9) AORTIC STENOSIS (ICD-424.1) CROHN'S DISEASE (ICD-555.9) DYSLIPIDEMIA (ICD-272.4)  Past Surgical History: Reviewed history from 08/04/2007 and no changes required. Aortic Valve Replacement:  tubal ligation  Family History: Reviewed history from 12/07/2007 and no changes required. Family History of Breast Cancer:Paternal Aunts No FH of Colon Cancer: Family History of Prostate Cancer:Father Family History of Crohn's:Sister Family History of Diabetes: Paternal Grandfather Family History of Heart Disease: Paternal Grandfather  Social History: Reviewed history from 12/07/2007 and no changes required. 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  Alcohol Use - yes 3 per week Daily Caffeine IZT:2-4 times weekly Illicit Drug Use - no Patient gets regular exercise.  Review of Systems  The patient denies allergy/sinus, anemia, anxiety-new, arthritis/joint pain, back pain, blood in urine, breast changes/lumps, change in vision, confusion, cough, coughing up blood, depression-new, fainting, fatigue, fever, headaches-new, hearing problems, heart murmur, heart rhythm changes, itching, menstrual pain, muscle pains/cramps, night sweats, nosebleeds, pregnancy symptoms, shortness of breath, skin rash, sleeping problems, sore throat, swelling of feet/legs, swollen lymph glands, thirst - excessive , urination - excessive , urination changes/pain, urine leakage, vision changes,  and voice change.    Vital Signs:  Patient profile:   55 year old female Height:      64 inches Weight:      146.2 pounds BMI:     25.19 Pulse rate:   90 / minute Pulse rhythm:   regular BP sitting:   120 / 68  (left arm) Cuff size:   regular  Vitals Entered By: Bernita Buffy CMA Deborra Medina) (August 11, 2010 2:39 PM)  Physical Exam  General:  Well developed, well nourished, no acute distress. Head:  Normocephalic and atraumatic. Eyes:  PERRLA, no icterus. Ears:  Normal auditory acuity. Mouth:  No deformity or lesions, dentition normal. Neck:  Supple; no masses or thyromegaly. Lungs:  Clear throughout to auscultation. Heart:  Regular rate and rhythm; no murmurs, rubs,  or bruits. Abdomen:  Soft, nontender and nondistended. No masses, hepatosplenomegaly or hernias noted. Normal bowel sounds. Msk:  Symmetrical with no gross deformities. Normal posture. Pulses:  Normal pulses noted. Extremities:  No  edema or deformities noted. Neurologic:  Alert and  oriented x4 Skin:  Intact without significant lesions or rashes. Psych:  Alert and cooperative. Normal mood and affect.   Impression & Recommendations:  Problem # 1:  CROHN'S DISEASE (ICD-555.9)  Crohn's colitis. clinically stable. mild patchy disease on most recent colonoscopy. unremarkable laboratories.   Plan colon  #1. Continue Azulfidine 2 g twice a day  #2. Routine office followup in one year. Sooner for questions or problems  #3. continue surveillance colonoscopy about every 2 years  Problem # 2:   rectal pain  description of intermittent fleeting rectal pain most consistent with proctalgia fugax. discussed with patient. Reassured. followup as needed for significantly worsening problem.  Patient Instructions: 1)  Copy sent to : Burnard Bunting, MD 2)  Please continue current medications.  3)  Please schedule a follow-up appointment in 1 year. 4)  The medication list was reviewed and reconciled.  All changed / newly  prescribed medications were explained.  A complete medication list was provided to the patient / caregiver.

## 2010-10-23 ENCOUNTER — Other Ambulatory Visit: Payer: Self-pay | Admitting: Dermatology

## 2010-10-28 NOTE — Assessment & Plan Note (Signed)
[facility] OFFICE NOTE   Brittany [name], GRINNELL                     MRN:          [id number]  DATE:12/28/2007                            DOB:          May 31, 1956    Hadlei returns today in followup.  She is about a year out from aortic  valve replacement.  She has gotten a tissue valve.  She is doing well.  She has not had any significant chest pain, PND, or orthopnea.  She is  back working as a Engineer, maintenance (IT).  She has significant colitis.  She was recently  started on Entocort by Dr. Henrene Pastor.  Apparently, she seems to be doing  better on this and it is to be tapered.  As far as I can tell, it is not  a steroid but has all of the effects of a steroid.  She has had a  significant headache, which is likely due to this medicine, so I told  her I was glad it was being tapered off.  Her coronary risk factors  include hypercholesterolemia, which she takes Crestor for.  She did not  require bypass surgery.   REVIEW OF SYSTEMS:  Otherwise negative.   She has no known allergies.   She is currently on Crestor 10 a day, calcium, aspirin, Centrum,  sulfasalazine b.i.d., metoprolol 25 a day, and Entocort.   PHYSICAL EXAMINATION:  GENERAL:  Remarkable for healthy-appearing, but  somewhat pale white female in no distress.  VITAL SIGNS:  Weight is 140, blood pressure is 116/72, pulse is 78 and  regular, respiratory rate 14, and afebrile.  HEENT:  Unremarkable.  NECK:  Carotids are normal without bruit.  No lymphadenopathy,  thyromegaly, or JVP elevation.  LUNGS:  Clear with good diaphragmatic motion.  No wheezing.  HEART:  S1 and S2 with a systolic ejection murmur through the tissue  valve.  No aortic insufficiency.  PMI normal.  ABDOMEN:  Benign.  Bowel sounds positive.  No tenderness.  No bruit.  No  hepatosplenomegaly.  No hepatojugular reflux.  EXTREMITIES:  Distal pulses are intact.  No edema.  NEURO:  Nonfocal.  SKIN:  Warm and  dry.  MUSCULOSKELETAL:  No muscular weakness.   Her electrocardiogram shows sinus rhythm with left atrial enlargement.  No acute changes.   IMPRESSION:  1. Aortic valve replacement.  Echo in August 2008 with mean gradient      only 10 mmHg.  Followup echo in August 2010.  Continue subacute      bacterial endocarditis prophylaxis.  2. Hypercholesterolemia.  Continue Crestor.  Lipid and liver profile      in 6 months.  Follow up with primary care MD.  3. Colitis.  Continue sulfasalazine.  Wean off Entocort.  Follow up      with Dr. Henrene Pastor.  4. Overall, I think the patient's heart valve is doing fine.  She will      call us if she has any      untoward fevers or new symptoms, but otherwise I will see her in      August 2010 for  her followup exam and echo.     [name]. Johnsie Cancel, MD, [facility]  Electronically Signed    PCN/MedQ  DD: 12/28/2007  DT: 12/28/2007  Job #: [id number]

## 2010-10-28 NOTE — Assessment & Plan Note (Signed)
[place] OFFICE NOTE   Brittany [name], Brittany [name]                     MRN:          [id number]  DATE:10/05/2007                            DOB:          August 02, 1955    HISTORY:  Brittany [name] presents today regarding ongoing management of her  Crohn's colitis.  She was last evaluated in the office August 24, 2007.  See that dictation for details.  At that time it was elected to adjust  her medical therapy due to evidence of active inflammation on  colonoscopy as well as elevated inflammatory markers.  Her sulfasalazine  was increased to 2 grams b.i.d.  She was also placed on Entocort 9 mg  daily.  She presents today for followup.  On the adjusted doses of  medications she is doing well.  She really has had little in the way of  GI complaints though thinks in retrospect maybe her abdomen is actually  feeling a little bit better.  She is having no medication side effects  to report.  No other issues or problems.   MEDICATIONS:  Her medications are unchanged from her last visit.   PHYSICAL EXAMINATION:  GENERAL APPEARANCE:  A limited physical  examination today finds a well-appearing female in no acute distress.  VITAL SIGNS:  Her blood pressure is 112/68, heart rate 80, weight is  144.6 pounds.  ABDOMEN:  Was not re-examined.   IMPRESSION:  1. Active Crohn's colitis.  Recent adjustment in medications as      discussed above.  Clinically doing well.  2. General medical problems including hypertension, dyslipidemia, and      aortic valve replacement, under the care of Dr. [name].   RECOMMENDATIONS:  1. Continue sulfasalazine and Entocort at current dosage.  2. Office followup in two months.  At that time we will repeat her      erythrocyte sedimentation rate and C-reactive protein.  My hope      would be to tape her Entocort at that time and continue      sulfasalazine with plans for repeat colonoscopy in two years,     unless otherwise clinically indicated.     Docia Chuck. Henrene Pastor, MD  Electronically Signed    JNP/MedQ  DD: 10/05/2007  DT: 10/05/2007  Job #: [id number]   cc:   Burnard Bunting, M.D.

## 2010-10-28 NOTE — Assessment & Plan Note (Signed)
[place] OFFICE NOTE   Brittany [name], Brittany [name]                     MRN:          [id number]  DATE:04/28/2007                            DOB:          18-Dec-1955    OFFICE CONSULTATION NOTE:   REASON FOR CONSULTATION:  Establish as a new GI patient.   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  For her Crohn's disease, she has been followed by Dr.  [name] for many years.  Her history of Crohn's dates back 30 years.  Multiple colonoscopies have revealed mild patchy colitis as well as  ileitis.  She has had no significant complications such as obstruction,  the need for surgery, abscess, or fistula.  She has been maintained long-  term on sulfasalazine 1 gm p.o. t.i.d.  She was tried on mesalamine,  though did not tolerate this due to gastric upset.  She denies any  active GI complaints.  Specifically, no nausea, vomiting, abdominal  pain, change in bowel habits, bleeding, or unexplained weight loss.  Her  last colonoscopy was performed about two years ago.  At that time, she  was found to have a mild pan colitis.  Multiple biopsies were obtained.  No dysplasia.Repeat surveillance in 2 years was recommended.   PAST MEDICAL HISTORY:  1. Hypertension.  2. Dyslipidemia.  3. Crohn's disease.  4. Aortic stenosis, status post aortic valve replacement in July,      2008.  5. Status post tubal ligation.   ALLERGIES:  No known drug allergies.   CURRENT MEDICATIONS:  1. Metoprolol 50 mg daily.  2. Sulfasalazine 1 gm p.o. t.i.d.  3. Crestor 10 mg daily.  4. Calcium supplement daily.  5. Aspirin 325 mg daily.  6. Multivitamin daily.   FAMILY HISTORY:  Twin sister, Valerie Roys, also with Crohn's disease, for  which she is seen  in the office.  Father with a history of prostate  cancer.   SOCIAL HISTORY:  Patient is married with two children.  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  She does not smoke  and occasionally uses alcohol.   REVIEW OF SYSTEMS:  Per diagnostic evaluation form.   PHYSICAL EXAMINATION:  A well-appearing female in no acute distress.  She is alert and oriented.  Blood pressure is 100/60, heart rate is 100 and regular.  Weight is  140.6 pounds.  She is 5 feet 4 inches in height.  HEENT:  Sclerae are anicteric.  Conjunctivae are pink.  Oral mucosa is  intact.  No adenopathy.  LUNGS:  Clear.  HEART:  Regular.  ABDOMEN:  Soft without tenderness, mass, or hernia.  EXTREMITIES:  Without edema.   IMPRESSION:  Chronic Crohn's ileocolitis.  Clinically doing well on  sulfasalazine.  Routine surveillance colonoscopy about due.   RECOMMENDATIONS:  1. Continue sulfasalazine.  2. Schedule surveillance colonoscopy at the patient's convenience.  3. Ongoing general medical  care with Dr. [name].     Docia Chuck. Henrene Pastor, MD  Electronically Signed    JNP/MedQ  DD: 04/28/2007  DT: 04/29/2007  Job #: [id number]   cc:   Burnard Bunting, M.D.

## 2010-10-28 NOTE — Discharge Summary (Signed)
Brittany [name], Brittany [name]              ACCOUNT NO.:  [id number]   MEDICAL RECORD NO.:  [id number]          PATIENT TYPE:  INP   LOCATION:  2017                         FACILITY:  [facility]   PHYSICIAN:  Gilford Raid, M.D.     DATE OF BIRTH:  1955/07/29   DATE OF ADMISSION:  12/20/2006  DATE OF DISCHARGE:  12/24/2006                               DISCHARGE SUMMARY   PRIMARY ADMITTING DIAGNOSIS:  Critical aortic stenosis.   ADDITIONAL/DISCHARGE DIAGNOSES:  1. Critical aortic stenosis.  2. Hypertension.  3. Hypercholesterolemia.  4. Crohn's disease.   PROCEDURES PERFORMED:  Aortic valve replacement with 21-mm Edwards magna  pericardial tissue valve.   HISTORY:  The patient is a 55 year old female who has been followed by  Dr. Johnsie Cancel since December 2007 with severe aortic stenosis.  She was  felt to be asymptomatic at that time and was started on an exercise  program.  Since that time, she has become symptomatic, having exertional  dyspnea and fatigue.  She underwent a repeat echocardiogram on November 22, 1976 which showed significant progression of her aortic stenosis with a  mean gradient of 62 and a peak gradient of 100 with peak aortic velocity  of close to 5 meters per second.  She subsequently underwent cardiac  catheterization which showed no significant coronary artery disease.  Her right heart pressures were normal with a PA pressure of 20/10.  She  was referred to Dr. Cyndia Bent for consideration of surgical repair of her  valve at this time.  The risks, benefits and alternatives of the  procedure were explained to the patient as well as her options for valve  replacement including the pros and cons of mechanical and tissue valve.  With her history of Crohn's disease, it was felt that she was probably  not a good candidate for Coumadin as she had some ongoing intermittent  blood loss secondary to her Crohn's.  She understood all the pros and  cons of tissue valve and agreed to proceed  with surgery using a  pericardial valve replacement.   HOSPITAL COURSE:  She was admitted to [facility] on December 20, 2006 and underwent aortic valve replacement as described in detail  above, performed by Dr. Cyndia Bent.  She tolerated the procedure well and  was transferred to the SICU in stable condition.  She was able to be  extubated shortly after surgery.  She is hemodynamically stable and  doing well on postoperative day #1.  At that time, her chest tubes and  hemodynamic monitoring devices were removed, and she was able to be  transferred to the floor.  She initially had some hypotension which  required a Neo-Synephrine drip, but this was weaned and discontinued  prior to her transfer from the unit.  She was started on low-dose beta  blocker, but her blood pressures have continued to remain a little on  the low side, running in the 48G to 500 systolic.  She has otherwise  remained stable and done well postoperatively.  She has been volume  overloaded and has been diuresed back down to within  2 kg of her  preoperative weight.  She had mild acute blood loss anemia which has  been stable and not required transfusion.  She is ambulating the halls  with cardiac rehab phase 1 and independently.  Her incisions are all  healing well.  Her labs on postoperative day #3 showed a hemoglobin of  8.3, hematocrit 24.9, platelets 139, white count 10.9, sodium 137,  potassium 4.1, BUN 10, creatinine 0.65.  It is felt that if she  continues to remain stable that she could potentially be ready for  discharge home within the next 24-48 hours.   Discharge medications are as follows.  1. Enteric-coated aspirin 325 mg daily.  2. Toprol XL 25 mg daily.  3. Crestor 10 mg every night.  4. Sulfasalazine 2 tablets t.i.d.  5. Tylox 1 to 2 q.4h. p.r.n. for pain.   DISCHARGE INSTRUCTIONS:  She is asked to refrain from driving, heavy  lifting or strenuous activity.  She may continue ambulating daily  and  using her incentive spirometer.  She may shower daily and clean her  incisions with soap and water.  She will continue a low-fat, low-sodium  diet.   DISCHARGE FOLLOWUP:  She will see Dr. Johnsie Cancel back in the office in 2  weeks.  She will then follow up with Dr. Cyndia Bent with a chest x-ray from  [facility] on January 25, 2007.  In the interim, if she  experiences any problems or has questions he is asked to contact our  office.      Suzzanne Cloud, P.A.      Gilford Raid, M.D.  Electronically Signed    GC/MEDQ  D:  12/23/2006  T:  12/24/2006  Job:  [id number]   cc:   [name]. Johnsie Cancel, MD, [facility]  Burnard Bunting, M.D.

## 2010-10-28 NOTE — Assessment & Plan Note (Signed)
[name] OFFICE NOTE   Brittany, [name]                     MRN:          [id number]  DATE:04/19/2007                            DOB:          18-Apr-1956    Brittany [name] returns today for followup.  She is status post tissue aortic  valve replacement for severe aortic stenosis with a bicuspid aortic  valve.  She has had a history of PSVT.   In talking to the patient, she has been doing well.  She has not had any  significant fevers.  Her chest appears well healed.  She is going to  cardiac rehab.  She has had a bit of a resting tachycardia.  This has  not bothered her, but it has been noted at cardiac rehab.  Her resting  heart rate tends to be around 100.  I told her since she is  asymptomatic, we would continue her current dose of beta blocker at 50  mg a day.  In the future, I may need to recheck her hemoglobin and  thyroid status.   Her P wave morphology has been normal on followup EKGs, and I do not  think this represents an ectopic atrial tachycardia.  However, I also  told [name] that should her resting heart rate continue to be elevated,  we may increase her beta blocker, and possibly do a monitor to document  normal circulating variation, and decreasing heart rate at night.   Otherwise, her review of systems is remarkable for an episode of  coughing that lasted 3 to 4 days.  It sounds like a URI.  She had no  fevers or sputum production with it.  I also went over with her the need  for blood cultures should she have any fevers of unidentified origin.   We also talked a little bit about going to the dentist.  She has an  appointment next month, and I told her I would call in a prescription  for amoxicillin to [facility] in [place].   CURRENT MEDICATIONS:  Include:  1. Crestor 10 mg a day.  2. Toprol 50 a day.  3. Aspirin a day.  4. Sulfasalazine 500 mg a day.  5. Calcium.   EXAMINATION:   Remarkable for a blood pressure of 115/70.  Resting heart  rate is 98 to 100 and regular.  She is afebrile.  Weight is 140 pounds.  Respiratory rate is 14.  Afebrile.  HEENT:  Normal.  Carotids normal without bruits.  No lymphadenopathy.  No thyromegaly.  No JVP elevation.  LUNGS:  Clear with good diaphragmatic motion.  No wheezing.  Sternum is well healed.  There is very slight keloid formation on her  sternal scar.  There is an S1 and S2 with very faint systolic murmur through the tissue  aortic valve.  No diastolic murmur of aortic insufficiency.  PMI is  normal.  ABDOMEN:  Benign.  Bowel sounds are positive.  No hepatosplenomegaly.  No hepatojugular reflux.  No AAA.  No tenderness.  Distal pulses are intact with no edema.  NEUROLOGIC:  Nonfocal.  SKIN:  Warm and dry.  MUSCULAR EXAM:  Normal with no weakness.   IMPRESSION:  1. Status post tissue aortic valve replacement, currently doing well.      Baseline echo normal.  No evidence of aortic insufficiency or      perivalvular leak.  No evidence of SBE.  Amoxicillin given for SB      prophylaxis.  2. Resting tachycardia.  History of paroxysmal supraventricular      tachycardia.  Rhythm appears sinus.  Continue beta blocker.  Follow      up in 3 months to further assess.  Possibly need to reassess      hemoglobin, TSH, and document normal circadian variation.  3. Hypercholesterolemia.  Continue Crestor 10 mg a day.  Followup      lipid and liver profile in 6 months.  4. History of colitis.  Continue the sulfasalazine 500 mg a day.      Currently quiescent with no abdominal cramping or diarrhea.   I will see her back in 3 months.  Overall, she is doing well.     Brittany [name]. Johnsie Cancel, MD, [facility]  Electronically Signed    PCN/MedQ  DD: 04/19/2007  DT: 04/19/2007  Job #: [id number]

## 2010-10-28 NOTE — Consult Note (Signed)
NEW PATIENT CONSULTATION   Brittany [name], Brittany [name]  DOB:  Nov 21, 1955                                        December 07, 2006  CHART #:  [id number]   CARDIAC/THORACIC SURGERY CONSULTATION   REFERRING PHYSICIAN:  [name]. Johnsie Cancel, M.D.   REASON FOR CONSULTATION:  Critical aortic stenosis.   CLINICAL HISTORY:  I was asked to evaluate this 55 year old woman for  consideration of aortic valve replacement by Dr. [name]. She has  been followed by Dr. Johnsie Cancel since December of 2007 with severe aortic  stenosis.  In December her mean aortic valve gradient was 42 with a peak  gradient of 65 and a peak aortic velocity of 4.01 meters per second.  She was started on an exercise program because she was felt to be  asymptomatic at that time and had an abnormal exercise response to a  treadmill test.  She had a Myoview exam that showed no evidence of  ischemia.  She said that she began walking on a treadmill at home but  had some difficulty with that and has had some exertional dyspnea and  fatigue.  She had a repeat echocardiogram done on November 23, 2006 which  showed significant progression of her aortic stenosis with a mean  gradient of 62 and a peak gradient of 100 with a peak aortic velocity of  close to 5 meters per second.  She subsequently underwent cardiac  catheterization which showed no coronary anomalies.  There was no  coronary disease.  Her right heart pressures were normal with a PA  pressure of 20/10.   REVIEW OF SYSTEMS:  GENERAL:  She denies any fever or chills.  She has  had no recent weight changes.  She has had some fatigue, particularly at  the end of the day.  HEENT:  Eyes are negative. ENT:  Negative.  She does see a dentist  regularly.  ENDOCRINE:  She denies diabetes and hypothyroidism.  CARDIOVASCULAR:  She has had exertional shortness of breath and  occasional chest pains.  She denies orthopnea, PND.  She has had no  palpitations. She denies peripheral  edema.  RESPIRATORY:  She denies cough or sputum production.  GASTROINTESTINAL:  She has had no nausea or vomiting.  She denies  melena.  She does have a history of Crohn's disease and said that she  occasionally has blood in her stool.  GENITOURINARY:  She denies dysuria and hematuria.  VASCULAR: She denies claudication and phlebitis.  NEUROLOGICAL:  She denies any focal weakness or numbness.  She denies  dizziness or syncope.  She has never had a [name] or a stroke.  MUSCULOSKELETAL:  She denies arthralgias or myalgias.  PSYCHIATRIC:  Negative.  HEMATOLOGICAL:  She denies any history of bleeding disorders or easy  bleeding.   PAST MEDICAL HISTORY:  Significant for hypercholesterolemia and  hypertension.  She has a history of Crohn's disease that is treated with  medication.  Her only previous surgery is a tubal ligation.   FAMILY HISTORY:  Negative for heart or valve disease.   SOCIAL HISTORY:  She is married and has two children.  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  She has never smoked.  She drinks about  one glass of wine daily.   MEDICATIONS:  1. Crestor 10 mg daily.  2. Lopressor 50 mg daily.  3. Sulfasalazine 1,000 mg t.i.d.   PHYSICAL EXAMINATION:  Vital Signs:  Blood pressure is 126/83 and her  pulse is 98 and regular.  Respiratory rate is 18 and unlabored.  Oxygen  saturation on room air is 99%.  General:  She is a well-developed white  female in no distress. HEENT:  Exam shows her to be normocephalic and  atraumatic.  Pupils equal, round, reactive to light and accommodation.  Extraocular muscles are intact. Her throat is clear.  Teeth are in good  condition.  Neck:  Exam shows significant parvus and tardus of her  carotid pulses with a transmitted murmur to both sides of her neck.  There is no jugular venous distention.  Cardiac:  Exam shows a regular  rate and rhythm with a harsh, high-pitched systolic murmur loudest over  her aorta heard throughout the  precordium.  Lungs are clear.  Abdomen:  Exam shows active bowel sounds.  The abdomen is soft and nontender.  There are no palpable masses or organomegaly.  Extremities:  Exam shows  no peripheral edema.  Pedal pulses are palpable bilaterally.  Skin:  Her  skin is warm and dry.  Neurological:  Exam shows her to be alert and  oriented x3.  Motor and sensory exams are grossly normal.   IMPRESSION:  [name] has critical aortic stenosis that has been  rapidly progressive since December of 2007.  She has limited exercise  ability and significant symptoms of exertion.  I agree that proceeding  with aortic valve replacement as quickly as possible is the best  treatment to prevent further complications including sudden death.  I  discussed the operation with her and her husband.  We discussed the  choices for aortic valve replacement including mechanical and tissue  valves.  She is hesitant to be put on Coumadin, especially with her  history of Crohn's disease and some ongoing blood loss from that.  I  would tend to agree with her that a tissue valve would be best given her  condition.  She understands the likelihood of eventually needing re-do  valve replacement due to structure deterioration and tissue valve over  her lifetime.  She would rather take that risk than to have an ongoing  risk of bleeding while on Coumadin.  I discussed the 1 to 2% risk per  year of thromboembolism or bleeding related complications on Coumadin.  Her and her husband are both in agreement with using a tissue valve.  I  discussed the other risks of surgery including bleeding, blood  transfusion, infection, stroke, myocardial infarction, heart block  requiring permanent pacemaker, organ failure and death.  They understand  and would like to proceed with surgery as quickly as possible.  I told  them I would plan on doing surgery on Monday, December 20, 2006.   Gilford Raid, M.D.  Electronically Signed   BB/MEDQ  D:   12/07/2006  T:  12/08/2006  Job:  [id number]   cc:   [name]. Johnsie Cancel, MD, [facility]  Burnard Bunting, M.D.  Copy for H and P for surgery.

## 2010-10-28 NOTE — Assessment & Plan Note (Signed)
OFFICE VISIT   Brittany [name], Brittany [name]  DOB:  1956-01-21                                        February 08, 2007  CHART #:  [id number]   Brittany [name] returned to my office today for a followup status post  aortic valve replacement using a 21-mm Edwards pericardial magna tissue  heart valve on 12/20/2006.  She was referred by Dr. [name] and has  seen him in postoperative followup.  She has been feeling well overall  and denies any chest pain or shortness of breath.  She said that in  cardiac rehab the rehab has been concerned because her baseline resting  heart rate has been around 100-105, and they have told her to slow down  when exercising due to a rapid heart rate.  She said she discussed this  with Dr. Johnsie Cancel, and he was not concerned about that.   On physical examination today her blood pressure is 133/88.  Her pulse  is 118 and regular.  Respiratory rate 18 and unlabored.  Oxygen  saturation on room air is 98%.  She looks well.  Cardiac exam shows a regular rate and rhythm with normal heart sounds.  I rechecked her pulse, and it had slowed down to 100.  Her lungs are clear.  The chest incision is healing well, and the  sternum is stable.  There is no peripheral edema.   Her medications are aspirin 325 mg daily, Toprol XL 50 mg daily, Crestor  10 mg nightly, sulfasalazine and Tylox p.r.n. for pain.   IMPRESSION:  Overall Brittany [name] is recovering well following her  surgery.  She does have a mild resting tachycardia despite beta blocker  therapy.  Part of this may be related to postoperative anemia, and I  would expect it to improve with time.  We will continue her on Lopressor  for now.  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  She will continue  with cardiac rehab.  She will follow up with Dr. [name] and will  contact me if  she develops any problems with her incision.   Gilford Raid, M.D.  Electronically Signed   BB/MEDQ  D:  02/08/2007  T:  02/09/2007  Job:  [id number]   cc:   [name]. Johnsie Cancel, MD, [facility]  Burnard Bunting, M.D.

## 2010-10-28 NOTE — Assessment & Plan Note (Signed)
[place] OFFICE NOTE   SHANAIA, SIEVERS                     MRN:          [id number]  DATE:02/07/2007                            DOB:          03-09-1956    HISTORY OF PRESENT ILLNESS:  Brittany [name] returns today for followup.  She is  status post aortic valve replacement for severe aortic stenosis in the  face of bicuspid aortic valve.  She has had history of PSVT as well.  This does not appear to be recurrent on beta blockers.   She elected to have a tissue valve despite her relatively young age.  In  talking to the patient, she has been doing fairly well.  She has some  fatigue.  She was fairly anemic postoperatively.  Her last hematocrit  the end of July was 28.2.   The patient has completed three weeks of cardiac rehab.  She has a few  more weeks to go.  They were somewhat concerned about her tachycardia.  Her resting heart rate seemed to be in the mid 90s.  Her baseline heart  rates prior to her surgery were in the mid 80s.  She is on low-dose beta  blocker.  I do not think that this is an issue.  Her rhythm is sinus,  and she does not have an atrial tachycardia.  She had a 2D  echocardiogram done which I reviewed.  Her valve is functioning normally  with good LV function.  Mean aortic valve gradient is only 11 mmHg with  a peak gradient of 24.   She has not had any fevers or signs of infection.   REVIEW OF SYSTEMS:  Otherwise, negative.  In particular, there has been  no exacerbation of her Crohn's disease.  The pain medications  perioperatively do give her some constipation.  There has been no melena  or bleeding.   Her medications currently include Crestor 10 mg daily, Toprol 50 mg  daily, aspirin a day, sulfasalazine 500 daily.   PHYSICAL EXAMINATION:  GENERAL:  Somewhat pale, healthy middle aged  white female in no distress.  She is afebrile.  Affect is appropriate.  VITAL SIGNS:  Blood  pressure 130/82, weight 141, pulse 95 and regular,  respiratory rate 14.  HEENT:  Normal.  Carotids are normal without bruits.  There is no  lymphadenopathy or JVP elevation.  No thyromegaly.  No bruits.  LUNG:  Clear with good diaphragmatic motion.  Sternum is well-healed.  HEART:  S1, S2 with no aortic insufficiency and a mild systolic murmur  through the valve.  PMI is normal.  ABDOMEN:  Protuberant.  No tenderness.  No bruits.  No AAA.  No  hepatosplenomegaly.  No hepatojugular reflux.  Bowel sounds are  positive.  EXTREMITIES:  Femorals are +4 bilaterally without bruits.  PTs are +3.  There is no lower extremity edema.  NEUROLOGICAL:  Nonfocal.  SKIN:  Warm and dry.  MUSCULOSKELETAL:  There is no muscular weakness.   STUDIES:  Baseline EKG shows sinus rhythm with left atrial enlargement.  He is otherwise normal.  IMPRESSION:  1. Status post tissue aortic valve replacement, baseline echo normal      with low gradients.  Continue SV prophylaxis.  Follow up echo in a      year.  2. Tachycardia with history of PSVT.  The patient is increasing her      activity levels.  Her tachycardia is relatively asymptomatic.  It      does not represent an atrial arrhythmia.  She is on low-dose      Toprol, and I would not increase it to 100 at this time as I think      it will make fatigue worse.  She will continue her cardiac rehab      and we will monitor it.  3. Ulcerative colitis, currently stable, despite surgery.  No real      change.  Continue sulfasalazine 500 mg daily.  Iron replacement      only in the form of multivitamins as this may also exacerbate her      Crohn's disease, and we will allow her to build up her blood count      slowly.  4. Postop anemia.  Improving slowly.  Iron supplementation and      multivitamins.  She will let us know if she has any change in her      bowel habits, and we will give her guaiac cards.  5. Hypercholesterolemia.  Continue Crestor 10 mg daily.   Follow up      liver profile in six months.  6. Previous hypertension.  Currently stable on Toprol which is helpful      for her tachycardia as well.   Overall, Brittany [name] is doing well and I will see her back in three months  when she is done with her cardiac rehabilitation, and we will further  assess her rhythm.     [name]. Johnsie Cancel, MD, [facility]  Electronically Signed    PCN/MedQ  DD: 02/07/2007  DT: 02/07/2007  Job #: [id number]

## 2010-10-28 NOTE — Assessment & Plan Note (Signed)
[facility] OFFICE NOTE   KYESHA, BALLA                     MRN:          [id number]  DATE:01/07/2007                            DOB:          11-12-1955    Ennifer returns today for followup.  She had bicuspid aortic valve with  severe aortic stenosis, she is status post aortic valve replacement by  Dr. Cyndia Bent.   Prior to her discharge she had a short burst of PSVT or atrial  arrhythmia, it did not appear to be atrial fibrillation.   The patient opted for a tissue valve so she did not have to be on  Coumadin.  She was discharged on beta blockers.  She had a question  about her beta blockers, it looks like she was discharged with 2  different prescriptions.  I looked at them and they were both metoprolol  ER 50 mg, I told her either one would be fine.   In talking to her she has been doing well.  She still gets occasional  exercise induced fatigue.  Her palpitations have been improved on beta  blockers, she has not had any rapid runs.  There has been no chest pain,  PND, or orthopnea.   Her wounds seem to be healing well.   REVIEW OF SYSTEMS:  Otherwise negative.   CURRENT MEDICATIONS:  1. Crestor 10 mg a day.  2. Toprol ER 50 mg a day.  3. An aspirin a day.  4. A sulfasalazine.   EXAMINATION:  Remarkable for a healthy-appearing middle-aged white  female in no distress.  Pulse is 90 and regular, blood pressure is  120/70, respiratory rate is 14, she is afebrile.  HEENT:  Normal.  Carotids are normal without bruit, there is no  lymphadenopathy and no thyromegaly, no JVP elevation.  LUNGS:  Clear with good diaphragmatic motion, no evidence of effusions.  Sternotomy is well-healed.  There is an S1-S2, there is no diastolic  murmur and a very faint systolic murmur.  The tissue valve sounds great.  PMI is normal.  ABDOMEN:  Benign, bowel sounds are positive.  There is no  hepatosplenomegaly, no  hepatojugular reflux, no tenderness, no AAA.  Distal pulses are intact with no edema.  Femorals are +4, PTs are +3.  NEURO:  Nonfocal, there is no muscular weakness.   EKG is essentially normal except for a bit of tachycardia.   IMPRESSION:  1. Stable status post tissue aortic valve replacement for severe      aortic stenosis, followup in 4-6 weeks for baseline      echocardiography.  I suspect the valve will look excellent.  She      will continue an aspirin a day.  There is no need for Coumadin      since she has a tissue valve.  2. Atrial arrhythmia pre-discharge, continue beta blocker therapy.      Palpitations seem to be resolved.  I suspect part of it has to do      with the pain and anemia of her surgery.  3. Hypercholesterolemia, continue Crestor 10  mg a day.  Followup lipid      and liver profile in 6 months.     [name]. Johnsie Cancel, MD, [facility]  Electronically Signed    PCN/MedQ  DD: 01/07/2007  DT: 01/07/2007  Job #: [id number]

## 2010-10-28 NOTE — Op Note (Signed)
Brittany [name], Brittany [name]              ACCOUNT NO.:  [id number]   MEDICAL RECORD NO.:  [id number]          PATIENT TYPE:  INP   LOCATION:  2310                         FACILITY:  [facility]   PHYSICIAN:  Gilford Raid, M.D.     DATE OF BIRTH:  21-Nov-1955   DATE OF PROCEDURE:  12/20/2006  DATE OF DISCHARGE:                               OPERATIVE REPORT   PREOPERATIVE DIAGNOSIS:  Critical aortic stenosis.   POSTOPERATIVE DIAGNOSIS:  Critical aortic stenosis.   OPERATIVE PROCEDURE:  Median sternotomy, extracorporeal circulation,  aortic valve replacement using a 21-mm Edwards pericardial Magna tissue  heart valve.   SURGEON:  Gilford Raid, MD   ASSISTANT:  Darylene Price, MD   SECOND ASSISTANT:  Suzzanne Cloud, P.A.-C.   ANESTHESIA:  General endotracheal.   CLINICAL HISTORY:  This patient is a 55 year old woman referred by Dr.  [name], who has a history of aortic stenosis dating back to  December 2007.  At that time a mean aortic valve gradient was 42 with a  peak gradient of 65 and a peak aortic velocity 4.01 m/sec.  She was  started on exercise program since she was felt to be asymptomatic at  that time.  She had an abnormal exercise response to a treadmill test  and a Myoview exam showed no evidence of ischemia.  She had a repeat  echocardiogram done on November 23, 2006, which showed significant  progression of her aortic stenosis with a mean gradient of 62 and peak  gradient that had increased to 100 with a peak aortic velocity of close  to 5 m/sec.  She subsequently had a cardiac catheterization performed  that showed no coronary disease.  Her right heart pressures were normal  with a PA pressure of 20/10.  Left ventricular function was normal.  After review of these studies and examination of the patient, it was  felt that aortic valve replacement was the best treatment.  I discussed  the operative procedure with her and her husband including alternatives  for valve replacement.   We discussed the pros and cons of tissue and  mechanical valves.  She had a history of Crohn disease with some chronic  ongoing GI blood loss, and therefore I did not feel that Coumadin would  be ideal.  I agreed that I thought Coumadin would probably be somewhat  risky given this history.  I discussed the potential for structural  valve deterioration using a tissue valve over her lifetime and I thought  that there was a pretty good chance that she would end up requiring a  redo aortic valve replacement at some point over her lifetime.  She  understood this and agreed that a tissue valve would be the best choice  for ger.  We discussed the other of benefits and risks of surgery  including but not limited to bleeding, blood transfusion, infection,  stroke, myocardial infarction, organ failure, and death.  She understood  and agreed to proceed.   OPERATIVE PROCEDURE:  The patient was taken to the operating room and  placed on table in a supine position.  After  induction of general  endotracheal anesthesia, a Foley catheter was placed in the bladder  using sterile technique.  Then the chest, abdomen and both lower  extremities were prepped and draped in the usual sterile manner.  Transesophageal echocardiogram showed critical aortic stenosis.  The  valve appeared to be bicuspid.  There was no mitral regurgitation.  There was no aortic insufficiency.  Left ventricular function appeared  well-preserved.  PA pressure and CVP were normal.   Then the chest was entered through a median sternotomy incision and the  pericardium opened in the midline.  Examination of the heart showed good  ventricular contractility.  The ascending aorta appeared to be of normal  size with no palpable plaques in it.   Then the patient was heparinized and when an adequate activated clotting  time was achieved, the distal ascending aorta was cannulated using a 20-  [place] aortic cannula for arterial inflow.  Venous  outflow was achieved  using a two-stage venous cannula through the right atrial appendage.  An  antegrade cardioplegia and vent cannula was inserted in the aortic root.  A left ventricular vent was placed through the right superior pulmonary  vein and a retrograde cardioplegia cannula placed through the right  atrium into the coronary sinus.   The patient was placed on cardiopulmonary bypass.  The aorta was then  crossclamped and 1000 mL of cold blood antegrade cardioplegia was  administered in the aortic root with quick arrest of the heart.  Systemic hypothermia to 28 degrees centigrade and topical hypothermia  with iced saline was used.  A temperature probe was placed in the septum  and an insulating pad in the pericardium.  Additional doses of  retrograde cardioplegia were given at about 20-minute intervals to  maintain myocardial temperature around 10 degrees centigrade.   The aorta was then opened transversely at the level of the sinotubular  junction.  Examination of the aortic valve showed that there was a  functionally bicuspid valve, although there were 3 commissures present.  There was minimal calcification in the valve but it was markedly  thickened with fusion of the commissures and a small central opening.  The right and left coronary ostia were identified.  The native valve was  excised.  Care was taken to remove all particulate debris.  The left  ventricle was irrigated with iced saline solution.  The annulus was  sized and a 21-mm Edwards pericardial Magna valve was chosen.  This had  model number 3000 and serial number [id number].  Then a series of pledgeted  2-0 Ethibond horizontal mattress sutures were placed around the annulus  with the pledgets in a subannular position.  The sutures were placed  through the sewing ring and the valve lowered in place.  The sutures  were tied sequentially.  The valve seated nicely and the leaflets  appeared to coapt well.  The right and  left coronary ostia were not  obstructed.  The patient was then rewarmed to 37 degrees centigrade.  The aorta was closed in two layers using continuous 4-0 Prolene suture.  The left side of the heart was then de-aired and the head placed in a  Trendelenburg position.  The crossclamp was removed with a time of 62  minutes.  There was spontaneous return of sinus rhythm.  The aortotomy  appeared hemostatic.  Then two temporary right ventricular and right  atrial pacing wires were placed and brought out through the skin.   When the patient had  rewarmed to 37 degrees centigrade, she was weaned  from cardiopulmonary bypass on no inotropic agents.  Total bypass time  was 91 minutes.  Cardiac function appeared excellent.  Transesophageal  echocardiogram showed a normal functioning aortic valve prosthesis  without evidence of regurgitation or perivalvular leak.  There was no  mitral regurgitation.  Left ventricular function appeared well-  preserved.  Protamine was then given and the venous and aortic cannulas  removed without difficulty.  Hemostasis was achieved without difficulty.  Two chest tubes were placed with a tube in the posterior pericardium and  one anterior mediastinum.  The pericardium was loosely reapproximated  over the heart.  The sternum was closed with #6 stainless steel wires.  The fascia was closed with a continuous #1 Vicryl suture.  The  subcutaneous tissue was closed with continuous 2-0 Vicryl and skin with  3-0 Vicryl subcuticular closure.  The sponge, needle and instrument  counts were correct  according to the scrub nurse.  Dry sterile dressings were applied over  the incisions and around the chest tubes, which were hooked to Pleur-  Evac suction.  The patient remained hemodynamically stable and was  transported to the SICU in guarded but stable condition.      Gilford Raid, M.D.  Electronically Signed     BB/MEDQ  D:  12/20/2006  T:  12/20/2006  Job:  [id number]    cc:   [name]. Johnsie Cancel, MD, [facility]

## 2010-10-28 NOTE — Discharge Summary (Signed)
Brittany [name], Brittany [name]              ACCOUNT NO.:  [id number]   MEDICAL RECORD NO.:  [id number]          PATIENT TYPE:  INP   LOCATION:  2017                         FACILITY:  [facility]   PHYSICIAN:  Gilford Raid, M.D.     DATE OF BIRTH:  12/13/1955   DATE OF ADMISSION:  12/20/2006  DATE OF DISCHARGE:  12/25/2006                               DISCHARGE SUMMARY   ADDENDUM:   Ms. Tischer was originally scheduled for discharge home on December 24, 2006.  However, she has had intermittent episodes of sinus tachycardia  which have been symptomatic with which the patient had palpitations.  Dr. Cyndia Bent has increased her beta blocker dose now to Lopressor 25 mg  b.i.d.  Her blood pressure has remained stable, and she is otherwise  doing well, but it was felt that she should be observed for another 24  hours in order to ensure that her heart rate would remain stable.  It is  anticipated that, if she remains stable from a rhythm standpoint, she  could potentially be discharged home on December 25, 2006.   Discharge medications are unchanged from the previously dictated  Discharge Summary with the exception of Toprol XL 25 mg daily has been  changed to Lopressor 25 mg b.i.d.  Otherwise, her medications,  instructions, and followup are as previously dictated.      Suzzanne Cloud, P.A.      Gilford Raid, M.D.  Electronically Signed    GC/MEDQ  D:  12/24/2006  T:  12/25/2006  Job:  [id number]   cc:   [name]. Johnsie Cancel, MD, [facility]  Burnard Bunting, M.D.

## 2010-10-28 NOTE — Assessment & Plan Note (Signed)
[place] OFFICE NOTE   Brittany [name], Brittany [name]                     MRN:          [id number]  DATE:11/23/2006                            DOB:          26-Mar-1956    Brittany [name] returns today for followup. She has probable bicuspid aortic valve  with aortic stenosis. I initially saw her back in December. She had a 2-  D echocardiogram at the time. At that time, her mean aortic valve  gradient was 42 with a peak gradient of 65 and a peak aortic velocity of  4.01 meters/second.   I was concerned about Brittany [name]. She was fairly sedentary. She claimed to be  asymptomatic but was inactive when we did a treadmill test on her. She  really did not do very well. She had relative tachycardia and a blunted  blood pressure response. She has not had any chest pain, syncope or  clinical shortness of breath.   Her Myoview showed no evidence of coronary disease.   We had her followup for a 2-D echocardiogram today and followup with me.  Unfortunately she has had fairly significant progression of her aortic  stenosis in a relatively short period of time. I reviewed both echoes  and they are accurate. Today her mean gradient was 62 mmHg, her peak  gradient was 100 mmHg and her peak aortic velocity was close to 5  meters/second.   I went over these results with Brittany [name] and her husband. This was the first  time I met the husband. I explained to them that she met criteria for  critical aortic stenosis and also fairly rapid progression between the  echocardiogram from December to June. I recommended to the patient that  she have aortic valve replacement within the next month or two. Needless  to say, Brittany [name] and her husband had a lot of questions. We spent over 30  minutes discussing this.   First of all, I explained the indications for replacement with her in  terms of the rapid progression in the echocardiographic criteria. I also  explained to her that I thought she truly was symptomatic with an  abnormal treadmill test and fatigue and being less active than she  possibly could be. Next, we talked about the issues of a mechanical  valve versus tissue valve. She is relatively young at 55 years and the  crux of the matter would come down to whether or not she wanted to be on  Coumadin. I explained to her that the tissue valves are lasting longer  but clearly if she had a tissue valve at age 59 she would require a  second surgery should she not develop any other life-threatening  illnesses.  Next, we talked about surgeons. In particular, the husband  had lots of questions in regards to minimally invasive surgery. I  explained to him that this had more to do with mitral valve surgery. I  did tell them that the premier institute in the country for aortic valve  surgery was probably the [facility]. The patient did not want to  leave the area.  I explained to her that if she did not want to leave  [place] I did not think the surgery needed to be done anywhere  else other than [facility]. Finally, we discussed timing of the surgery. I  explained to her I was concerned about the rapid progression on echo and  thought it should be done sometime in the next 1-3 months. Next, we  talked about the issue of needing a heart cath. The risks including  stroke, need for emergency surgery and bleeding were discussed. She  understands the need to define the coronary anatomy and aortic root size  before proceeding with open heart surgery. We will try to set this up  expeditiously.   REVIEW OF SYSTEMS:  The patient's review of systems is remarkable  primarily for fatigue and exertional shortness of breath. She denied any  chest pain or syncope.   CURRENT MEDICATIONS:  1. Crestor 10 mg a day.  2. Multivitamins.  3. Toprol 50 a day.   PHYSICAL EXAMINATION:  VITAL SIGNS:  Remarkable for a weight of 142,  blood pressure 130/80,  pulse 85 and regular. Respiratory rate is 14. She  is afebrile.  GENERAL:  She is a healthy-appearing, middle-age female in no distress.  HEENT:  Normal.  NECK:  There is no thyromegaly and no lymphadenopathy. She has  significant parvus et tardus with her murmur radiating to the neck.  LUNGS:  Clear with normal diaphragmatic motion, no wheezing.  HEART:  There is an S1, the second heart sound is muffled. There is an  AS murmur.  ABDOMEN:  Benign. Bowel sounds are positive. There is no tenderness, no  hepatosplenomegaly, no hepatojugular reflux, no organomegaly. The  femorals are normal. There is no radiofemoral delay. There is no bruit.  Distal pulses are intact with no edema.  NEUROLOGIC:  Nonfocal.  SKIN:  Warm and dry.  MUSCULOSKELETAL:  There is no muscular weakness.   Her EKG shows sinus rhythm with LVH by voltage criteria. As indicated, I  took over 15 minutes to review both her current echocardiogram and her  echo from December 2007 to make sure that the velocities and valve  gradients were accurate.   IMPRESSION:  1. Bicuspid aortic valve with severe aortic stenosis. Progression of      gradients by echo within 6 months. Abnormal treadmill response with      blunted blood pressure and fairly limited exercise ability. Refer      for aortic valve replacement.  2. Hypercholesterolemia. Continue Crestor which also may help in      regards to progression of her aortic valve disease. The patient      will have followup lipid and liver profile.  3. Need for heart catheterization given her valve disease, the risks      explained. Routine lab work and chest x-ray will be done. The      patient at the time of her cath will have an aortic root done. To      size her aorta, we will not attempt to cross the valve as this will      increase her risk of stroke and there is no need for it. We will      define a coronary anatomy for the surgeons.  I will leave it up to the discretion of  the surgeons, either Dr. Cyndia Bent,  Roxy Manns or Servando Snare in regards to whether or not they want a TEE.  Personally I do not think this is necessary  prior to her surgery.     [name]. Johnsie Cancel, MD, [facility]  Electronically Signed    PCN/MedQ  DD: 11/23/2006  DT: 11/24/2006  Job #: [id number]

## 2010-10-28 NOTE — Cardiovascular Report (Signed)
Brittany [name], Brittany [name]              ACCOUNT NO.:  [id number]   MEDICAL RECORD NO.:  [id number]          PATIENT TYPE:  OIB   LOCATION:  2899                         FACILITY:  [facility]   PHYSICIAN:  [name]. Johnsie Cancel, MD, FACCDATE OF BIRTH:  1955-09-09   DATE OF PROCEDURE:  DATE OF DISCHARGE:  11/25/2006                            CARDIAC CATHETERIZATION   CORONARY ARTERIOGRAPHY:   INDICATIONS:  Dyspnea, severe aortic stenosis, need for AVR.   Cine catheterization was done with a 6-French arterial sheath and a 7-  French venous sheath.   Left main coronary artery was normal.   Left anterior descending artery was normal.   Circumflex coronary artery was nondominant and normal.   The right coronary artery is dominant and normal.   There were no coronary anomalies.   VENTRICULOGRAPHY:  LAO ventriculography showed mild aortic root  dilatation with a bicuspid aortic valve.   Right heart pressures were done due to the patient's dyspnea and to rule  out significant elevation in PA pressures prior to surgery.  Mean right  atrial pressure was 7.  RV pressure was 26/5.  PA pressure was 20/10.  Mean pulmonary capillary wedge pressure was 8.   IMPRESSION:  The patient has severe and progressive aortic stenosis with  symptoms.   She already has an appointment Dr. Cyndia Bent.  She will decide whether she  wants a tissue valve or a mechanical valve.  She does not need bypass  and her right heart pressures are normal.   She tolerated the procedure well.      [name]. Johnsie Cancel, MD, [facility]  Electronically Signed     PCN/MEDQ  D:  11/25/2006  T:  11/25/2006  Job:  [id number]

## 2010-10-28 NOTE — Assessment & Plan Note (Signed)
[facility] OFFICE NOTE   HARLO, JASO                     MRN:          [id number]  DATE:08/24/2007                            DOB:          September 22, 1955    HISTORY:  The patient presents today for office follow-up post  colonoscopy.  She was evaluated in the office on April 28, 2007, to  establish as a new GI patient.  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  The patient has been on sulfasalazine 1 gram p.o. t.i.d., for some time  now.  Her recent routine surveillance colonoscopy was performed July 28, 2007.  At that time, she was found to have diffuse mildly active  colitis involving most portions of the colon.  Biopsies revealed chronic  inflammatory bowel disease in the ascending colon, transverse colon,  descending colon, and sigmoid colon.  There was rectal sparing.  The  ileum was normal.  There was no evidence of dysplasia.  We did obtain  CBC, comprehensive metabolic panel, erythrocyte sedimentation rate, and  C reactive protein.  Laboratories revealed evidence of inflammation with  an elevated sedimentation rate and C reactive protein.  As well she was  mildly anemic with a hemoglobin of 11.9.  I have reviewed all of these  findings with her in detail today.  Since her procedure, she has had no  new problems.  As a way of review, she continues to report no symptoms.  She describes normal formed bowel movements once daily, no blood, and no  abdominal pain.  She denies extraintestinal manifestations of Crohn's  disease upon review.   ALLERGIES:  No known drug allergies.   CURRENT MEDICATIONS:  1. Metoprolol 50 mg daily.  2. Sulfasalazine 1 gram t.i.d.  3. Crestor 10 mg  daily.  4. Calcium.  5. Aspirin 325 mg daily.  6. Multivitamin.   PHYSICAL EXAMINATION:  GENERAL:  A well-appearing female in no acute  distress.  VITAL SIGNS:  Blood pressure 120/78, heart rate 72, weight 142.8 pounds.  ABDOMEN:  Not reexamined.   IMPRESSION:  1. Active Crohn's colitis on 3 grams of sulfasalazine daily.  2. Elevated sedimentation rate, C reactive protein, and mild anemia      secondary to ongoing active inflammatory bowel disease.  3. Other general medical problems as outlined.   RECOMMENDATIONS:  I discussed with the patient today that it might be  reasonable to adjust her medical regimen given the fact that she has  persisting active disease as manifested by mucosal and laboratory  abnormalities.  We discussed a number of options including increasing  aminosalicylates, Entocort therapy, systemic steroids, immunomodulators,  and biologic agents.  The difficulty with this patient is that, aside  from blood work, it will be difficult to ascertain what impact therapies  have, short of colonoscopy, as she has no relevant symptoms to follow.  However, I do feel it is important  to control the inflammation to reduce  the risk of progressive anemia or possibly even colon cancer.  As such,  and after an extensive discussion (30 minutes), we have decided to  increase her sulfasalazine to 2 grams p.o. b.i.d.  As well as initiate  Entocort 9 mg daily.  I would like to see her in the office for routine  follow-up in about four weeks.  Assuming she is tolerating the  adjustment in medications, I would like her to continue on Entocort for  about three months.  At that time, I will repeat her inflammatory  markers and hope to taper the Entocort.  We would keep her colonoscopy  anniversary date at two years assuming there is no significant decline  in her clinical condition.  She understood all of the above.     Docia Chuck. Henrene Pastor, MD  Electronically Signed    JNP/MedQ  DD:  08/24/2007  DT: 08/25/2007  Job #: [id number]   cc:   Burnard Bunting, M.D.

## 2010-10-31 NOTE — Assessment & Plan Note (Signed)
[facility]                            CARDIOLOGY OFFICE NOTE   ZAMYA, CULHANE                     MRN:          [id number]  DATE:06/29/2006                            DOB:          Jan 27, 1956    Ms. Bells is a 55 year old patient referred by Dr. [name] to  reconsult on the patient regarding her aortic valve disease.  The  patient had previously been seen by Dr. Dannielle Burn.  She had a 2 D  echocardiogram done in 2005.   At the time she had mild aortic stenosis with a mean gradient of 20 mmHg  and a peak gradient of 43.  There was a suspicion for bicuspid aortic  valve.  The patient had a follow-up echo performed in the office  June 11, 2006.  Her LV function continues to be normal.  However,  she now has severe aortic stenosis with a mean gradient of 42 and a peak  gradient of 65.  Maximum velocity is 4 m/sec.   The patient initially had poor insight into what was going on.  We had a  lengthy discussion in excess of 30 minutes, including the use of a heart  model to explain the problem to her.  I explained to her that she was  born with a bicuspid valve and that it was calcified and that two  leaflets were trying to do the work of three.  I was a little surprised  at the rapid progression in her gradients and velocities over the course  of 2 years.   However, epidemiologically she is in the age bracket where most people  with a bicuspid valve either become symptomatic or require surgery.   The patient was closely questioned, and she would appear to be  asymptomatic.  She has not had any significant PND, orthopnea,  palpitations or chest pain.  She did have one episode of dizziness in  the middle of the night, which would appear to be either GI in nature  and associated with some lower back pain.  I do not think it was  cardiac.   The patient's coronary risk factors include hypertension.  She has been  on Altace for quite some time.   She is also hyperlipidemic and on  Crestor.   I had a long discussion with her and tried to explain to her that I did  not think she should be on afterload reduction with fixed aortic  stenosis.  We will come up with a different drug for her blood pressure.   The patient's dentition is in good shape.  She stopped taking SBE  prophylaxis this year with the new AHA guidelines.   Again, on close questioning she is not very active.  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   I would definitely classify her as asymptomatic severe aortic stenosis.   We talked about the natural history of aortic stenosis in her age range  with velocities of 4 m/sec.  I  think that she has a 60-80% chance of  becoming symptomatic in the next 1-3 years with a peak velocity in  excess of 4 m/sec. and a mean gradient that high.  We also had a long  discussion about surgical options and need to consider where she wants  her surgery done.   Finally, since she has coronary risk factors we had a lengthy discussion  about the need for ruling out coronary artery disease.  I would prefer  to doing this by doing a stress Myoview for starters.  At that point in  time I can make sure she does not have hypotension with exercise and  make sure she actually has reasonable exercise capacity.  Our techs will  not do an exercise treadmill on a patient with severe aortic stenosis,  and I will perform this study personally.   MEDICATIONS:  1. Crestor 10 mg a day.  2. Altace 2.5 mg a day.  3. Multivitamins.   The patient is happily married.  She has 2 children.  She works as a  Engineer, maintenance (IT).  She is fairly sedentary.   She has no known allergies.   She does not have a significant past surgical history.   She has a history of Crohn disease, which has been inactive.   FAMILY HISTORY:  Remarkable for mother dying at age 36 of  Alzheimer's,  father dying at age 39.  She has 2 sisters and a brother that are  healthy.   She has had a previous tubal ligation.   PHYSICAL EXAMINATION:  VITAL SIGNS:  The blood pressure is 140/88, pulse  is 84 and regular.  HEENT:  Normal.  NECK:  Carotids have no parvus but have moderate tardus.  LUNGS:  Clear.  CARDIAC:  There is an S1, S2.  Her AS murmur actually sounds more  moderate with the second heart sound preserved.  There is no AI.  ABDOMEN:  Benign.  EXTREMITIES:  Intact pulses, no edema.  NEUROLOGIC:  Nonfocal.   Her EKG is normal without significant LVH.   IMPRESSION:  The patient essentially presents with bicuspid aortic valve  and severe aortic stenosis with rapid progression and gradients and  velocities over the course of 2 years.  I suspect she will need surgery  in the next 1-3 years.   However, she currently would fall into the category of asymptomatic  aortic stenosis.  Her second heart sound is preserved.  I think that  given her current age, continued watchful waiting is in order.  However,  the patient will start to look more closely into her surgical options  and to find out what type of procedure she needs.  We talked at length  today about a Ross procedure, mechanical versus tissue valves and  homografts.  I also explained to her what surgeons are available here in  [place] at [place] and [place] as well as in [place] and the  [facility].   I will perform her stress Myoview to rule out concomitant coronary  disease, to assess her exercise tolerance and make sure her hemodynamics  are good with exercise.  She will then have a follow-up echo in 3 months  to see me.   Her dentition is in good shape.  In regard to her blood pressure, we  will stop her Altace.  In regard to her fixed aortic stenosis, this is  not a good medicine.  I will start her on Norvasc 10 mg a day  and we will follow her blood pressure on this.   Further  recommendations will be based on the results of her stress  Myoview.     [name]. Johnsie Cancel, MD, [facility]  Electronically Signed    PCN/MedQ  DD: 06/29/2006  DT: 06/29/2006  Job #: [id number]   cc:   Burnard Bunting, M.D.

## 2010-10-31 NOTE — Assessment & Plan Note (Signed)
[facility] OFFICE NOTE   MARGARIE, MCGUIRT                     MRN:          [id number]  DATE:04/21/2006                            DOB:          January 28, 1956    Berma comes in and states she is doing fine.  The patient's biopsies from  her last colonoscopy revealed no significant pathology.  She is presently  taking Azulfidine two t.i.d. and has been for a number of years, along with  multivitamins, Altace and Crestor 10 mg daily.  She sees Dr. [name] as her  primary care doctor; he does physicals on her as well as laboratory studies,  which she says have been normal in the past.  She has also been followed by  our cardiologist for her aortic stenosis.  We talked about who she sees  since Dr. Dannielle Burn is going to work in [place] as part of the [employer]  and I suggested and gave her the name of Dr. Modesta Messing.  I did give  her some Lialda to see if she could take two of these a day, which is  essentially equivalent to what she was taking; I gave her the samples and  gave her a prescription and I am sure that Dr. [name] would notify us if  there was any change in her laboratory studies, especially concerning her  renal function studies on these medications.     Clarene Reamer, MD  Electronically Signed    SML/MedQ  DD: 04/21/2006  DT: 04/22/2006  Job #: [id number]

## 2010-10-31 NOTE — Op Note (Signed)
NAME:  Anicka, Stuckert.:  [id number]   MEDICAL RECORD NO.:  [id number]                   PATIENT TYPE:   LOCATION:                                       FACILITY:   PHYSICIAN:  Youlanda Roys. Harlow Mares, M.D.               DATE OF BIRTH:   DATE OF PROCEDURE:  01/25/2002  DATE OF DISCHARGE:                                 OPERATIVE REPORT   PREOPERATIVE DIAGNOSES:  1. Lesion left upper cheek, less than 0.5 cm.  2. Lesion left middle cheek, 0.5 cm.  3. Lesion left lower cheek, greater than 0.5 cm.  4. Lesion right cheek, greater than 0.5 cm, all lesions of undetermined     behavior.   POSTOPERATIVE DIAGNOSES:  1. Lesion left upper check, less than 0.5 cm.  2. Lesion left middle cheek, 0.5 cm.  3. Lesion left lower check, greater than 0.5 cm.  4. Lesion right check, greater than 0.5 cm, all lesions of undetermined     behavior.  5. Wounds of the face, intermediate level, total of greater than 2.5 cm.   PROCEDURE PERFORMED:  Excision lesion left upper cheek and left middle  cheek, each of which was 0.5 cm or less.  Excision lesion left lower cheek  and right cheek, each of which greater than 0.5 cm and intermediate wound  repair greater than 2.5 cm.   SURGEON:  Youlanda Roys. Harlow Mares, M.D.   ANESTHESIA:  1% Xylocaine with epinephrine plus bicarb.   CLINICAL COURSE:  This is a 55 year old woman with pigmented lesions of the  face that had enlarged and changed and it is necessary to remove these.  The  nature of the procedure and the risks and scarring were discussed with her  in great detail and she understood all this and wished to proceed.   DESCRIPTION OF PROCEDURE:  The patient was taken to the operating room and  placed supine.  An elliptical excisions were carefully marked with the  relaxed skin tension lines.  She was then prepped with Betadine and draped  in sterile drapes.  Successful local anesthesia was achieved and the  elliptical excisions were  performed with the lesions removed.  The wounds  were thoroughly irrigated  and excellent hemostasis having been confirmed,  one small wound was closed with 60-Prolene simple interrupted sutures and  all the other ones closed with layered approach using 5-0 Vicryl interrupted  deep and 5-0 Vicryl intradermal deep sutures and 6-0 Prolene simple sutures  or simple running sutures as needed.  Antibiotic ointment and dressings were  applied.  She tolerated the procedure well.  __________, Tylenol for pain.  She will remove the dressings and it is okay to shower starting tomorrow.  Will see her back in the office in six days.  Youlanda Roys. Harlow Mares, M.D.    DMB/MEDQ  D:  01/25/2002  T:  01/27/2002  Job:  [id number]

## 2010-10-31 NOTE — Assessment & Plan Note (Signed)
[name]                            CARDIOLOGY OFFICE NOTE   Brittany, [name]                     MRN:          [id number]  DATE:07/27/2006                            DOB:          05-Jul-1955    Brittany [name] is seen today in followup.  She has severe AS.   When I initially saw her, she was clearly asymptomatic, but inactive.   We did a treadmill test on her.  Her hemodynamics were not ideal.  She  had relatively fast tachycardia and a blunted blood pressure response.   She has not had any chest pain, syncope or shortness of breath  clinically.   Her Myoview images showed no evidence of coronary disease.   The patient is still relatively young at 71.   This is a new diagnosis for her.   I told her I would like her to get involved in a workout program where  she increases her aerobic fitness.   She will then have a followup treadmill and/or CPX study in about 3  months.  At that point in time, we will be able to better see whether  she is deconditioned or if her heart valve is truly limiting her.   It would be nice to get the patient closer to age 54 where a tissue  valve would be an option.   I spent quite a bit of time with [name] going over her diagnosis and the  interplay between her stress test, her clinical symptoms and the  diagnosis of severe AS.   She seems comfortable with the idea of trying to increase her physical  fitness level, and seeing how she does, I do not think she needs an  aortic valve replacement at this time.   The patient is tolerating her Toprol at 50 mg well.   Her blood pressure today is 130/86.  Pulse is 88 and regular.  HEENT:  Normal. Carotids are normal without bruit.  There is mild  parvus, and no tardus.  LUNGS:  Clear.  There is an S1 and S2.  Second heart sound is preserved.  She has a  moderate to severe AS murmur, which is mid to late peaking.  There is no  AI.  ABDOMEN:  Benign.  LOWER  EXTREMITIES:  Intact pulses.  No edema.   IMPRESSION:  Severe aortic stenosis with poor exercise tolerance on  treadmill.  Increase aerobic activity and followup CPX probably in 3 to  6 months.  Followup echocardiogram in June.   The patient will continue her Toprol.  This may be increased in the  future, as her heart rate and blood pressure are still fairly healthy.   She has hypercholesterolemia, and has been started on Crestor.  This  also may decrease the progression of aortic stenosis.   I will see her back in June.     [name]. Johnsie Cancel, MD, [facility]  Electronically Signed    PCN/MedQ  DD: 07/27/2006  DT: 07/27/2006  Job #: [id number]

## 2010-10-31 NOTE — Procedures (Signed)
[facility]                              EXERCISE TREADMILL   ABBAGALE, Brittany [name]                     MRN:          [id number]  DATE:07/02/2006                            DOB:          [birth year]    Brittany [name] is a 55 year old patient I have seen in the clinic for  severe aortic stenosis.  She was scheduled for a stress Myoview, however  because of her severe aortic stenosis her exercise treadmill was  personally supervised by a physician.   The patient exercised 5 minutes in a standard Bruce protocol.  Exercise  was stopped due to fatigue.  The patient had a somewhat exaggerated  heart rate response.  At rest, standing on the treadmill heart rate was  133 and increased to 173.  Peak blood pressure was 151/99.  She did drop  a little bit at peak exercise to 143/93 and then recovery with heart  rate of 155.  Her blood pressure was 136/87.  She had no significant  chest pain or presyncope.   IMPRESSION:  Exercise stress test with somewhat blunted blood pressure  response and exaggerated heart rate response, possibly secondary to her  severe aortic stenosis.  EKG showed sinus rhythm with LVH and  nonspecific ST-T wave changes.  The patient was injected with Myoview at  4 minutes and she will have her symptographic imaging to follow.   I will start the patient on Toprol 50 mg a day.  Her ACE inhibitor has  been stopped.  I will see her back in the clinic in 4 weeks.     Brittany [name]. Johnsie Cancel, MD, [facility]  Electronically Signed    PCN/MedQ  DD: 07/02/2006  DT: 07/02/2006  Job #: [id number]

## 2010-11-14 ENCOUNTER — Other Ambulatory Visit: Payer: Self-pay | Admitting: Internal Medicine

## 2010-12-11 ENCOUNTER — Encounter: Payer: Self-pay | Admitting: Internal Medicine

## 2011-01-08 ENCOUNTER — Other Ambulatory Visit: Payer: Self-pay | Admitting: Internal Medicine

## 2011-01-12 ENCOUNTER — Encounter: Payer: Self-pay | Admitting: Cardiovascular Disease

## 2011-01-16 ENCOUNTER — Ambulatory Visit (INDEPENDENT_AMBULATORY_CARE_PROVIDER_SITE_OTHER): Payer: BC Managed Care – PPO | Admitting: Cardiovascular Disease

## 2011-01-16 ENCOUNTER — Encounter: Payer: Self-pay | Admitting: Cardiovascular Disease

## 2011-01-16 VITALS — BP 142/80 | HR 98 | Ht 64.0 in | Wt 143.8 lb

## 2011-01-16 DIAGNOSIS — I10 Essential (primary) hypertension: Secondary | ICD-10-CM

## 2011-01-16 DIAGNOSIS — K5090 Crohn's disease, unspecified, without complications: Secondary | ICD-10-CM

## 2011-01-16 DIAGNOSIS — I359 Nonrheumatic aortic valve disorder, unspecified: Secondary | ICD-10-CM

## 2011-01-16 DIAGNOSIS — E7800 Pure hypercholesterolemia, unspecified: Secondary | ICD-10-CM

## 2011-01-16 DIAGNOSIS — Z954 Presence of other heart-valve replacement: Secondary | ICD-10-CM

## 2011-01-16 DIAGNOSIS — Z952 Presence of prosthetic heart valve: Secondary | ICD-10-CM

## 2011-01-16 NOTE — Assessment & Plan Note (Signed)
She needs colon qo year and has chrones  Despite guidelines I may feel better giving her SBE  Will discuss with Dr Henrene Pastor before next one

## 2011-01-16 NOTE — Progress Notes (Signed)
Brittany [name] is seen today in followup for her aortic valve replacement. By her relatively young age he chose a tissue valve. Her surgery was done in 2008. Echo 7/10 showed a mean gradient of 18 mmHg and peak of 32 mmHg. There was no periprosthetic leak. She is active with no SOB, palpitatoins chest pain or edema. Has chrones disease followed by Dr Henrene Pastor. Did not get SBE prophylaxis with last colonoscopy. Review of guidelines circ.ahajournals.org/cgi/reprint/CIRCULATIONAHA.144.818563   indicate this is appropriate  Father died unexpectedly this week he was 51  ROS: Denies fever, malais, weight loss, blurry vision, decreased visual acuity, cough, sputum, SOB, hemoptysis, pleuritic pain, palpitaitons, heartburn, abdominal pain, melena, lower extremity edema, claudication, or rash.  All other systems reviewed and negative  General: Affect appropriate Healthy:  appears stated age 55: normal Neck supple with no adenopathy JVP normal no bruits no thyromegaly Lungs clear with no wheezing and good diaphragmatic motion Heart:  S1/S2 SEM through tissue valve  No ,rub, gallop or click PMI normal Abdomen: benighn, BS positve, no tenderness, no AAA no bruit.  No HSM or HJR Distal pulses intact with no bruits No edema Neuro non-focal Skin warm and dry No muscular weakness   Current Outpatient Prescriptions  Medication Sig Dispense Refill  . aspirin 325 MG EC tablet Take 325 mg by mouth daily.        . Calcium-Vitamin D-Vitamin K (CALCIUM SOFT CHEWS PO) Take 1 tablet by mouth 2 (two) times daily.        . metoprolol tartrate (LOPRESSOR) 25 MG tablet Take 25 mg by mouth 2 (two) times daily.        . Multiple Vitamins-Minerals (CENTRUM) tablet Take 1 tablet by mouth daily.        . rosuvastatin (CRESTOR) 10 MG tablet Take 10 mg by mouth daily.        Marland Kitchen sulfaSALAzine (AZULFIDINE) 500 MG tablet TAKE 4 TABLETS TWO TIMES A DAY  240 tablet  1    Allergies  Review of patient's allergies indicates no known  allergies.  Electrocardiogram:  Assessment and Plan

## 2011-01-16 NOTE — Assessment & Plan Note (Signed)
Gradient a bit high 7/10  F/U echo this month

## 2011-01-16 NOTE — Patient Instructions (Signed)
Your physician recommends that you schedule a follow-up appointment in: [place]  Your physician recommends that you continue on your current medications as directed. Please refer to the Current Medication list given to you today. Your physician has requested that you have an echocardiogram. Echocardiography is a painless test that uses sound waves to create images of your heart. It provides your doctor with information about the size and shape of your heart and how well your heart's chambers and valves are working. This procedure takes approximately one hour. There are no restrictions for this procedure. PT'S CONVENIENCE  DX  AVR

## 2011-01-16 NOTE — Assessment & Plan Note (Signed)
Element of white coat syndrome.  F/U BP during echo and home monitoring

## 2011-01-16 NOTE — Assessment & Plan Note (Signed)
Cholesterol is at goal.  Continue current dose of statin and diet Rx.  No myalgias or side effects.  F/U  LFT's in 6 months. No results found for this basename: LDLCALC             

## 2011-01-19 ENCOUNTER — Encounter: Payer: Self-pay | Admitting: *Deleted

## 2011-02-03 ENCOUNTER — Ambulatory Visit (HOSPITAL_COMMUNITY): Payer: BC Managed Care – PPO | Attending: Internal Medicine | Admitting: Radiology

## 2011-02-03 DIAGNOSIS — I10 Essential (primary) hypertension: Secondary | ICD-10-CM | POA: Insufficient documentation

## 2011-02-03 DIAGNOSIS — Z954 Presence of other heart-valve replacement: Secondary | ICD-10-CM | POA: Insufficient documentation

## 2011-02-03 DIAGNOSIS — Z952 Presence of prosthetic heart valve: Secondary | ICD-10-CM

## 2011-02-03 DIAGNOSIS — I079 Rheumatic tricuspid valve disease, unspecified: Secondary | ICD-10-CM | POA: Insufficient documentation

## 2011-02-03 DIAGNOSIS — I059 Rheumatic mitral valve disease, unspecified: Secondary | ICD-10-CM | POA: Insufficient documentation

## 2011-02-03 DIAGNOSIS — E785 Hyperlipidemia, unspecified: Secondary | ICD-10-CM | POA: Insufficient documentation

## 2011-02-03 DIAGNOSIS — I359 Nonrheumatic aortic valve disorder, unspecified: Secondary | ICD-10-CM

## 2011-03-09 ENCOUNTER — Other Ambulatory Visit: Payer: Self-pay | Admitting: Internal Medicine

## 2011-03-31 LAB — I-STAT EC8
Acid-base deficit: 4 — ABNORMAL HIGH
Acid-base deficit: 4 — ABNORMAL HIGH
BUN: 9
Bicarbonate: 18 — ABNORMAL LOW
Bicarbonate: 21.2
Chloride: 108
Chloride: 112
Glucose, Bld: 131 — ABNORMAL HIGH
HCT: 30 — ABNORMAL LOW
Hemoglobin: 10.2 — ABNORMAL LOW
Operator id: 261841
Operator id: 294401
Sodium: 151 — ABNORMAL HIGH
TCO2: 22
pCO2 arterial: 34.9 — ABNORMAL LOW
pCO2 arterial: 40
pCO2 arterial: 41.8
pH, Arterial: 7.321 — ABNORMAL LOW
pH, Arterial: 7.332 — ABNORMAL LOW

## 2011-03-31 LAB — TYPE AND SCREEN: Antibody Screen: POSITIVE

## 2011-03-31 LAB — POCT I-STAT 4, (NA,K, GLUC, HGB,HCT)
Glucose, Bld: 102 — ABNORMAL HIGH
Glucose, Bld: 73
Glucose, Bld: 92
HCT: 20 — ABNORMAL LOW
HCT: 21 — ABNORMAL LOW
HCT: 34 — ABNORMAL LOW
Hemoglobin: 11.2 — ABNORMAL LOW
Hemoglobin: 7.1 — CL
Hemoglobin: 8.8 — ABNORMAL LOW
Operator id: 3342
Operator id: 3342
Operator id: 3342
Potassium: 3.5
Potassium: 3.7
Potassium: 4.2
Sodium: 135
Sodium: 138
Sodium: 138
Sodium: 142

## 2011-03-31 LAB — APTT
aPTT: 29
aPTT: 55 — ABNORMAL HIGH

## 2011-03-31 LAB — CBC
HCT: 25.6 — ABNORMAL LOW
HCT: 28.9 — ABNORMAL LOW
Hemoglobin: 12.4
Hemoglobin: 9.4 — ABNORMAL LOW
Hemoglobin: 9.5 — ABNORMAL LOW
MCHC: 32.6
MCHC: 32.9
MCHC: 33.1
MCHC: 33.2
MCHC: 33.5
MCV: 86.8
MCV: 87.4
MCV: 87.4
Platelets: 117 — ABNORMAL LOW
Platelets: 88 — ABNORMAL LOW
RBC: 2.8 — ABNORMAL LOW
RBC: 2.83 — ABNORMAL LOW
RBC: 2.98 — ABNORMAL LOW
RBC: 3.22 — ABNORMAL LOW
RBC: 3.3 — ABNORMAL LOW
RBC: 4.36
RDW: 15 — ABNORMAL HIGH
RDW: 15.6 — ABNORMAL HIGH
RDW: 16.5 — ABNORMAL HIGH
WBC: 12.9 — ABNORMAL HIGH
WBC: 14.4 — ABNORMAL HIGH
WBC: 16 — ABNORMAL HIGH
WBC: 8.1

## 2011-03-31 LAB — POCT I-STAT 3, VENOUS BLOOD GAS (G3P V)
Acid-base deficit: 2
Acid-base deficit: 5 — ABNORMAL HIGH
Bicarbonate: 20
O2 Saturation: 100
TCO2: 21
TCO2: 23
pCO2, Ven: 31.6 — ABNORMAL LOW
pO2, Ven: 369 — ABNORMAL HIGH

## 2011-03-31 LAB — POCT I-STAT 3, ART BLOOD GAS (G3+)
Acid-base deficit: 4 — ABNORMAL HIGH
Acid-base deficit: 5 — ABNORMAL HIGH
Bicarbonate: 18.7 — ABNORMAL LOW
Bicarbonate: 19.9 — ABNORMAL LOW
O2 Saturation: 100
O2 Saturation: 99
Operator id: 261841
Operator id: 294401
Operator id: 3342
Patient temperature: 35.4
Patient temperature: 37.9
TCO2: 20
TCO2: 22
pCO2 arterial: 34.1 — ABNORMAL LOW
pCO2 arterial: 36.5
pH, Arterial: 7.321 — ABNORMAL LOW
pH, Arterial: 7.391
pO2, Arterial: 115 — ABNORMAL HIGH
pO2, Arterial: 182 — ABNORMAL HIGH
pO2, Arterial: 508 — ABNORMAL HIGH

## 2011-03-31 LAB — POCT I-STAT GLUCOSE: Operator id: 3342

## 2011-03-31 LAB — URINALYSIS, ROUTINE W REFLEX MICROSCOPIC
Bilirubin Urine: NEGATIVE
Hgb urine dipstick: NEGATIVE
Ketones, ur: NEGATIVE
Nitrite: NEGATIVE
Protein, ur: NEGATIVE
Specific Gravity, Urine: 1.01
Urobilinogen, UA: 0.2

## 2011-03-31 LAB — BASIC METABOLIC PANEL
BUN: 12
CO2: 23
CO2: 23
CO2: 23
CO2: 26
Calcium: 7.3 — ABNORMAL LOW
Calcium: 7.7 — ABNORMAL LOW
Calcium: 8.3 — ABNORMAL LOW
Chloride: 107
Creatinine, Ser: 0.65
Creatinine, Ser: 0.66
Creatinine, Ser: 0.69
GFR calc Af Amer: >60
GFR calc Af Amer: >60
GFR calc Af Amer: >60
GFR calc Af Amer: >60
GFR calc non Af Amer: >60
GFR calc non Af Amer: >60
Glucose, Bld: 107 — ABNORMAL HIGH
Glucose, Bld: 129 — ABNORMAL HIGH
Sodium: 137

## 2011-03-31 LAB — CREATININE, SERUM
GFR calc Af Amer: >60
GFR calc Af Amer: >60
GFR calc non Af Amer: >60

## 2011-03-31 LAB — COMPREHENSIVE METABOLIC PANEL
BUN: 11
CO2: 24
Calcium: 8.9
Chloride: 104
Creatinine, Ser: 0.79
GFR calc Af Amer: >60
GFR calc non Af Amer: >60
Glucose, Bld: 86
Total Bilirubin: 0.6

## 2011-03-31 LAB — PROTIME-INR
INR: 1
Prothrombin Time: 13.2
Prothrombin Time: 20.1 — ABNORMAL HIGH

## 2011-03-31 LAB — BLOOD GAS, ARTERIAL
Acid-Base Excess: 2.1 — ABNORMAL HIGH
pCO2 arterial: 31.6 — ABNORMAL LOW
pO2, Arterial: 93.7

## 2011-03-31 LAB — MAGNESIUM
Magnesium: 2.6 — ABNORMAL HIGH
Magnesium: 2.7 — ABNORMAL HIGH
Magnesium: 4 — ABNORMAL HIGH

## 2011-03-31 LAB — HEMOGLOBIN AND HEMATOCRIT, BLOOD
HCT: 21.4 — ABNORMAL LOW
Hemoglobin: 7.2 — CL

## 2011-03-31 LAB — HEMOGLOBIN A1C: Hgb A1c MFr Bld: 5.4

## 2011-03-31 LAB — PLATELET COUNT: Platelets: 126 — ABNORMAL LOW

## 2011-04-14 ENCOUNTER — Other Ambulatory Visit: Payer: Self-pay | Admitting: *Deleted

## 2011-04-14 MED ORDER — METOPROLOL TARTRATE 25 MG PO TABS: 25.0000 mg | ORAL_TABLET | Freq: Two times a day (BID) | ORAL | Status: DC

## 2011-05-12 ENCOUNTER — Other Ambulatory Visit: Payer: Self-pay | Admitting: Internal Medicine

## 2011-07-09 ENCOUNTER — Other Ambulatory Visit: Payer: Self-pay | Admitting: Internal Medicine

## 2011-08-04 ENCOUNTER — Ambulatory Visit (INDEPENDENT_AMBULATORY_CARE_PROVIDER_SITE_OTHER): Payer: BC Managed Care – PPO | Admitting: Internal Medicine

## 2011-08-04 ENCOUNTER — Encounter: Payer: Self-pay | Admitting: Internal Medicine

## 2011-08-04 VITALS — BP 128/88 | HR 76 | Ht 64.0 in | Wt 149.4 lb

## 2011-08-04 DIAGNOSIS — K5010 Crohn's disease of large intestine without complications: Secondary | ICD-10-CM

## 2011-08-04 MED ORDER — SULFASALAZINE 500 MG PO TABS: ORAL_TABLET | ORAL | Status: DC

## 2011-08-04 NOTE — Patient Instructions (Signed)
We have sent the following medications to your pharmacy for you to pick up at your convenience: Azulfadine You will be due for a recall colonoscopy in July 2013. We will send you a reminder in the mail when it gets closer to that time.

## 2011-08-04 NOTE — Progress Notes (Signed)
HISTORY OF PRESENT ILLNESS:  Brittany [name] is a 56 y.o. female with aortic stenosis status post aortic valve replacement, hyperlipidemia, hypertension, and chronic Crohn's colitis for which she is followed in this office. Last evaluated February 2012. Clinically stable at that time. Continues on Azulfidine 2 g twice a day. Outside laboratories have been stable. Last surveillance colonoscopy July 2011. Mild patchy colitis endoscopically as well as microscopically. Routine followup in 2 years recommended. Since her last visit, she has done well. Minimal GI complaints. Occasional abdominal discomfort. No significant bleeding or change in bowel habits. Recent blood work with Dr. [name] reported to be unremarkable.  REVIEW OF SYSTEMS:  All non-GI ROS negative.   Past Medical History  Diagnosis Date  . Hypercholesterolemia   . Colitis   . Hypertension     Unspecified  . Anemia, unspecified   . Aortic stenosis   . Crohn's disease   . Dyslipidemia     Past Surgical History  Procedure Date  . Aortic valve replacement   . Tubal ligation     Social History Brittany [name]  reports that she has never smoked. She has never used smokeless tobacco. She reports that she drinks alcohol. She reports that she does not use illicit drugs.  family history includes Breast cancer in her paternal aunt; Crohn's disease in her sister; Diabetes in her paternal grandfather; Heart disease in her paternal grandfather; and Prostate cancer in her father.  There is no history of Colon cancer.  No Known Allergies     PHYSICAL EXAMINATION: Vital signs: BP 128/88  Pulse 76  Ht 5' 4"  (1.626 m)  Wt 149 lb 6.4 oz (67.767 kg)  BMI 25.64 kg/m2  SpO2 96%  LMP 07/29/2011 General: Well-developed, well-nourished, no acute distress HEENT: Sclerae are anicteric, conjunctiva pink. Oral mucosa intact Lungs: Clear Heart: Regular Abdomen: soft, nontender, nondistended, no obvious ascites, no peritoneal signs, normal  bowel sounds. No organomegaly. Extremities: No edema Psychiatric: alert and oriented x3. Cooperative    ASSESSMENT:  #1. Crohn's colitis. Clinically stable on Azulfidine.   PLAN:  #1. Continue Azulfidine. Refilled prescription #2. Surveillance colonoscopy due to July 2013. Patient aware #3. Interval GI followup as needed.

## 2011-09-06 ENCOUNTER — Other Ambulatory Visit: Payer: Self-pay | Admitting: Internal Medicine

## 2011-11-08 ENCOUNTER — Other Ambulatory Visit: Payer: Self-pay | Admitting: Internal Medicine

## 2011-12-11 ENCOUNTER — Encounter: Payer: Self-pay | Admitting: Internal Medicine

## 2011-12-15 ENCOUNTER — Encounter: Payer: Self-pay | Admitting: Internal Medicine

## 2012-01-08 ENCOUNTER — Other Ambulatory Visit: Payer: Self-pay | Admitting: Internal Medicine

## 2012-01-20 ENCOUNTER — Ambulatory Visit (AMBULATORY_SURGERY_CENTER): Payer: BC Managed Care – PPO | Admitting: *Deleted

## 2012-01-20 ENCOUNTER — Telehealth: Payer: Self-pay | Admitting: *Deleted

## 2012-01-20 VITALS — Ht 64.0 in | Wt 147.0 lb

## 2012-01-20 DIAGNOSIS — K5010 Crohn's disease of large intestine without complications: Secondary | ICD-10-CM

## 2012-01-20 MED ORDER — NA SULFATE-K SULFATE-MG SULF 17.5-3.13-1.6 GM/177ML PO SOLN: ORAL | Status: DC

## 2012-01-20 NOTE — Telephone Encounter (Signed)
Dr Henrene Pastor:  Pt is scheduled for recall colonoscopy 8/21.  She is requesting to use Suprep.  Her sister used it and liked it better than MoviPrep.  Is it okay?

## 2012-01-20 NOTE — Progress Notes (Signed)
Pt is requesting to use Suprep instead of MoviPrep.  Note sent to Dr. Henrene Pastor.

## 2012-01-25 ENCOUNTER — Telehealth: Payer: Self-pay | Admitting: *Deleted

## 2012-01-25 NOTE — Telephone Encounter (Signed)
Yes, that would be fine if it is a split prep (I believe that it is )

## 2012-01-25 NOTE — Telephone Encounter (Signed)
Called pt and let her know Suprep was okay to use as prep for procedure.  Instructions given to pt in [facility].

## 2012-01-25 NOTE — Telephone Encounter (Signed)
Pt given instructions for Suprep.

## 2012-02-03 ENCOUNTER — Telehealth: Payer: Self-pay | Admitting: Internal Medicine

## 2012-02-03 ENCOUNTER — Ambulatory Visit (AMBULATORY_SURGERY_CENTER): Payer: BC Managed Care – PPO | Admitting: Internal Medicine

## 2012-02-03 ENCOUNTER — Encounter: Payer: Self-pay | Admitting: Internal Medicine

## 2012-02-03 VITALS — BP 143/87 | HR 83 | Temp 98.0°F | Resp 15 | Ht 64.0 in | Wt 147.0 lb

## 2012-02-03 DIAGNOSIS — K599 Functional intestinal disorder, unspecified: Secondary | ICD-10-CM

## 2012-02-03 DIAGNOSIS — Z1211 Encounter for screening for malignant neoplasm of colon: Secondary | ICD-10-CM

## 2012-02-03 DIAGNOSIS — K5010 Crohn's disease of large intestine without complications: Secondary | ICD-10-CM

## 2012-02-03 MED ORDER — DISPOSABLE ENEMA 19-7 GM/118ML RE ENEM: 1.0000 | ENEMA | Freq: Once | RECTAL | Status: DC

## 2012-02-03 MED ORDER — SODIUM CHLORIDE 0.9 % IV SOLN: 500.0000 mL | INTRAVENOUS | Status: DC

## 2012-02-03 NOTE — Telephone Encounter (Signed)
She vomited with the Suprep - got it down but vomited. Stools are liquid but brownish. I advised her to come 30 minutes early so we would have a chance to give enemas.

## 2012-02-03 NOTE — Op Note (Signed)
[place]  [address]. [place], [address]   COLONOSCOPY PROCEDURE REPORT  PATIENT: Brittany, [name]  MR#: [id number] BIRTHDATE: [birth year] , 80  yrs. old GENDER: Female ENDOSCOPIST: Eustace Quail, MD REFERRED BY: PROCEDURE DATE:  02/03/2012 PROCEDURE:   Colonoscopy with biopsy ASA CLASS:   Class II INDICATIONS:high risk patient with previously diagnosed Crohn's disease: large intestine.   Long standing. Last exam 12-2009 MEDICATIONS: MAC sedation, administered by CRNA and propofol (Diprivan) 253m IV  DESCRIPTION OF PROCEDURE:   After the risks benefits and alternatives of the procedure were thoroughly explained, informed consent was obtained.  A digital rectal exam revealed no abnormalities of the rectum.   The LB [id number] endoscope was introduced through the anus and advanced to the cecum, which was identified by both the appendix and ileocecal valve. No adverse events experienced.   The quality of the prep was Suprep adequate The instrument was then slowly withdrawn as the colon was fully examined.      COLON FINDINGS: Patchy mild colitis, mostly in the distal transverse and descending colon.  4Q bx q 10 cm taken (n=33).  No polyps or other abnormalities.  Retroflexed views revealed no abnormalities. The time to cecum=5.42 minutes  Withdrawal time=18.37 minutes.  The scope was withdrawn and the procedure completed.  COMPLICATIONS: There were no complications.  ENDOSCOPIC IMPRESSION: Patchy mild colitis, mostly in the distal transverse and descending colon.  4Q bx q 10 cm taken (n=33).  No polyps or other abnormalities.  RECOMMENDATIONS: 1.  Await biopsy results 2.  Repeat Colonoscopy in 2 years if no dysplasia on bx 3. continue current medications  eSigned:  JEustace Quail MD 02/03/2012 12:05 PM   cc: RBurnard Bunting MD and The Patient   PATIENT NAME:  Brittany, ChilesMR#: [id number]

## 2012-02-03 NOTE — Progress Notes (Signed)
Patient did not experience any of the following events: a burn prior to discharge; a fall within the facility; wrong site/side/patient/procedure/implant event; or a hospital transfer or hospital admission upon discharge from the facility. (G8907) Patient did not have preoperative order for IV antibiotic SSI prophylaxis. (G8918)  

## 2012-02-03 NOTE — Progress Notes (Signed)
Propofol per [facility] crna, meds titrated per crna during procedure. See scanned intra procedure report. ewm

## 2012-02-03 NOTE — Patient Instructions (Signed)
Mild colitis, biopsies taken today. Repeat colonoscopy in 2 years,if no dysplasia. Continue current medications. Call us with any questions or concerns. Thank you!!   YOU HAD AN ENDOSCOPIC PROCEDURE TODAY AT [facility]: Refer to the procedure report that was given to you for any specific questions about what was found during the examination.  If the procedure report does not answer your questions, please call your gastroenterologist to clarify.  If you requested that your care partner not be given the details of your procedure findings, then the procedure report has been included in a sealed envelope for you to review at your convenience later.  YOU SHOULD EXPECT: Some feelings of bloating in the abdomen. Passage of more gas than usual.  Walking can help get rid of the air that was put into your GI tract during the procedure and reduce the bloating. If you had a lower endoscopy (such as a colonoscopy or flexible sigmoidoscopy) you may notice spotting of blood in your stool or on the toilet paper. If you underwent a bowel prep for your procedure, then you may not have a normal bowel movement for a few days.  DIET: Your first meal following the procedure should be a light meal and then it is ok to progress to your normal diet.  A half-sandwich or bowl of soup is an example of a good first meal.  Heavy or fried foods are harder to digest and may make you feel nauseous or bloated.  Likewise meals heavy in dairy and vegetables can cause extra gas to form and this can also increase the bloating.  Drink plenty of fluids but you should avoid alcoholic beverages for 24 hours.  ACTIVITY: Your care partner should take you home directly after the procedure.  You should plan to take it easy, moving slowly for the rest of the day.  You can resume normal activity the day after the procedure however you should NOT DRIVE or use heavy machinery for 24 hours (because of the sedation medicines used during the  test).    SYMPTOMS TO REPORT IMMEDIATELY: A gastroenterologist can be reached at any hour.  During normal business hours, 8:30 AM to 5:00 PM Monday through Friday, call [phone].  After hours and on weekends, please call the GI answering service at [phone] who will take a message and have the physician on call contact you.   Following lower endoscopy (colonoscopy or flexible sigmoidoscopy):  Excessive amounts of blood in the stool  Significant tenderness or worsening of abdominal pains  Swelling of the abdomen that is new, acute  Fever of 100F or higher  FOLLOW UP: If any biopsies were taken you will be contacted by phone or by letter within the next 1-3 weeks.  Call your gastroenterologist if you have not heard about the biopsies in 3 weeks.  Our staff will call the home number listed on your records the next business day following your procedure to check on you and address any questions or concerns that you may have at that time regarding the information given to you following your procedure. This is a courtesy call and so if there is no answer at the home number and we have not heard from you through the emergency physician on call, we will assume that you have returned to your regular daily activities without incident.  SIGNATURES/CONFIDENTIALITY: You and/or your care partner have signed paperwork which will be entered into your electronic medical record.  These signatures attest to  the fact that that the information above on your After Visit Summary has been reviewed and is understood.  Full responsibility of the confidentiality of this discharge information lies with you and/or your care-partner.

## 2012-02-04 ENCOUNTER — Telehealth: Payer: Self-pay | Admitting: *Deleted

## 2012-02-04 NOTE — Telephone Encounter (Signed)
  Follow up Call-  Call back number 02/03/2012  Post procedure Call Back phone  # [phone]  Permission to leave phone message Yes     Patient questions:  Do you have a fever, pain , or abdominal swelling? no Pain Score  0 *  Have you tolerated food without any problems? yes  Have you been able to return to your normal activities? yes  Do you have any questions about your discharge instructions: Diet   no Medications  no Follow up visit  no  Do you have questions or concerns about your Care? no  Actions: * If pain score is 4 or above: No action needed, pain <4.

## 2012-02-08 ENCOUNTER — Encounter: Payer: Self-pay | Admitting: Internal Medicine

## 2012-02-17 ENCOUNTER — Encounter: Payer: Self-pay | Admitting: Cardiovascular Disease

## 2012-02-17 ENCOUNTER — Ambulatory Visit (INDEPENDENT_AMBULATORY_CARE_PROVIDER_SITE_OTHER): Payer: BC Managed Care – PPO | Admitting: *Deleted

## 2012-02-17 VITALS — BP 120/76 | HR 67 | Ht 64.0 in | Wt 144.0 lb

## 2012-02-17 DIAGNOSIS — E7800 Pure hypercholesterolemia, unspecified: Secondary | ICD-10-CM

## 2012-02-17 DIAGNOSIS — I359 Nonrheumatic aortic valve disorder, unspecified: Secondary | ICD-10-CM

## 2012-02-17 DIAGNOSIS — K5090 Crohn's disease, unspecified, without complications: Secondary | ICD-10-CM

## 2012-02-17 DIAGNOSIS — I10 Essential (primary) hypertension: Secondary | ICD-10-CM

## 2012-02-17 NOTE — Assessment & Plan Note (Signed)
Well controlled.  Continue current medications and low sodium Dash type diet.    

## 2012-02-17 NOTE — Assessment & Plan Note (Signed)
Colonoscopy done qo year.  Recent one fine.  Will discuss with Dr Henrene Pastor issues of SBE prophylaxis.  She has it in 2011 but no this year

## 2012-02-17 NOTE — Patient Instructions (Signed)
Your physician wants you to follow-up in: YEAR WITH DR NISHAN  You will receive a reminder letter in the mail two months in advance. If you don't receive a letter, please call our office to schedule the follow-up appointment.  Your physician recommends that you continue on your current medications as directed. Please refer to the Current Medication list given to you today. 

## 2012-02-17 NOTE — Assessment & Plan Note (Signed)
Cholesterol is at goal.  Continue current dose of statin and diet Rx.  No myalgias or side effects.  F/U  LFT's in 6 months. No results found for this basename: LDLCALC             

## 2012-02-17 NOTE — Progress Notes (Signed)
Patient ID: Brittany [name], female   DOB: February 16, 1956, 56 y.o.   MRN: [id number] [name] is seen today in followup for her aortic valve replacement. By her relatively young age he chose a tissue valve. Her surgery was done in 2008. Echo 7/10 showed a mean gradient of 18 mmHg and peak of 32 mmHg. There was no periprosthetic leak. She is active with no SOB, palpitatoins chest pain or edema. Has chrones disease followed by Dr Henrene Pastor. Did not get SBE prophylaxis with last colonoscopy. Review of guidelines circ.ahajournals.org/cgi/reprint/CIRCULATIONAHA.496.759163  indicate this is appropriate  8/12 Study Conclusions  - Left ventricle: The cavity size was normal. Wall thickness was normal. The estimated ejection fraction was 60%. Wall motion was normal; there were no regional wall motion abnormalities. Left ventricular diastolic function parameters were normal. - Aortic valve: A 82m bioprosthesis was present. The leaflets are seen opening well. There is good function. - Mitral valve: Mild regurgitation.   Mean AV gradient 189mg and Peak 2657m  ROS: Denies fever, malais, weight loss, blurry vision, decreased visual acuity, cough, sputum, SOB, hemoptysis, pleuritic pain, palpitaitons, heartburn, abdominal pain, melena, lower extremity edema, claudication, or rash.  All other systems reviewed and negative  General: Affect appropriate Healthy:  appears stated age HEE35ormal Neck supple with no adenopathy JVP normal no bruits no thyromegaly Lungs clear with no wheezing and good diaphragmatic motion Heart:  S1/S2 SEM murmur, no rub, gallop or click PMI normal Abdomen: benighn, BS positve, no tenderness, no AAA no bruit.  No HSM or HJR Distal pulses intact with no bruits No edema Neuro non-focal Skin warm and dry No muscular weakness   Current Outpatient Prescriptions  Medication Sig Dispense Refill  . aspirin 325 MG EC tablet Take 325 mg by mouth daily.        . Calcium-Vitamin  D-Vitamin K (CALCIUM SOFT CHEWS PO) Take 1 tablet by mouth 2 (two) times daily.        . metoprolol tartrate (LOPRESSOR) 25 MG tablet Take 1 tablet (25 mg total) by mouth 2 (two) times daily.  60 tablet  12  . Multiple Vitamins-Minerals (CENTRUM) tablet Take 1 tablet by mouth daily.        . rosuvastatin (CRESTOR) 10 MG tablet Take 20 mg by mouth daily.       . sMarland KitchenlfaSALAzine (AZULFIDINE) 500 MG tablet Take 4 tablets by mouth twice daily  240 tablet  11    Allergies  Review of patient's allergies indicates no known allergies.  Electrocardiogram:  01/16/11  SR rate 98 LAE nonspecific ST/T wave changes  Today NSR rate 68 LAE improved ST;s and lower rate than 2012  Assessment and Plan

## 2012-02-17 NOTE — Assessment & Plan Note (Signed)
AVR sounds normal. No AR echo 2012 ok  SBE prophylaxis.

## 2012-03-10 ENCOUNTER — Other Ambulatory Visit: Payer: Self-pay | Admitting: Internal Medicine

## 2012-03-14 ENCOUNTER — Other Ambulatory Visit: Payer: Self-pay | Admitting: Internal Medicine

## 2012-05-15 ENCOUNTER — Other Ambulatory Visit: Payer: Self-pay | Admitting: Internal Medicine

## 2012-05-16 ENCOUNTER — Other Ambulatory Visit: Payer: Self-pay | Admitting: *Deleted

## 2012-05-16 MED ORDER — METOPROLOL TARTRATE 25 MG PO TABS: 25.0000 mg | ORAL_TABLET | Freq: Two times a day (BID) | ORAL | Status: DC

## 2012-07-05 ENCOUNTER — Encounter: Payer: Self-pay | Admitting: Cardiovascular Disease

## 2012-07-15 ENCOUNTER — Other Ambulatory Visit: Payer: Self-pay | Admitting: Internal Medicine

## 2012-11-14 ENCOUNTER — Other Ambulatory Visit: Payer: Self-pay | Admitting: Internal Medicine

## 2013-01-02 ENCOUNTER — Encounter: Payer: Self-pay | Admitting: Internal Medicine

## 2013-01-02 ENCOUNTER — Ambulatory Visit (INDEPENDENT_AMBULATORY_CARE_PROVIDER_SITE_OTHER): Payer: BC Managed Care – PPO | Admitting: Internal Medicine

## 2013-01-02 VITALS — BP 102/62 | HR 72 | Ht 63.75 in | Wt 145.0 lb

## 2013-01-02 DIAGNOSIS — K5010 Crohn's disease of large intestine without complications: Secondary | ICD-10-CM

## 2013-01-02 NOTE — Progress Notes (Signed)
HISTORY OF PRESENT ILLNESS:  Brittany [name] is a 57 y.o. female with aortic stenosis status post aortic valve replacement, hypertension, hyperlipidemia, and chronic Crohn's colitis for which she is followed in this office. She was last seen in the office in 08/04/2011. She was asymptomatic and continued on Azulfidine as her solo agent for chronic Crohn's colitis. She did undergo surveillance colonoscopy 02/03/2012. She was found to have a mild patchy colitis, mostly in the distal transverse and descending colon. Multiple biopsies were taken throughout the colon. The right colon and rectosigmoid colon biopsies were normal. The transverse and left colon revealed chronic active colitis without dysplasia. Followup colonoscopy in 2 years recommended. She presents today for routine office followup. Her only GI complaint is transient constipation which is new. No abdominal pain, bleeding, or diarrhea. She has been compliant with her medical therapy. No interval medical problems. Had comprehensive blood work earlier this year with Dr. Reynaldo [name], which she reports to be unremarkable.  REVIEW OF SYSTEMS:  All non-GI ROS negative upon extensive review  Past Medical History  Diagnosis Date  . Hypercholesterolemia   . Colitis   . Hypertension     Unspecified  . Anemia, unspecified   . Aortic stenosis   . Crohn's disease   . Dyslipidemia     Past Surgical History  Procedure Laterality Date  . Aortic valve replacement  2008  . Tubal ligation      Social History Brittany [name]  reports that she has never smoked. She has never used smokeless tobacco. She reports that  drinks alcohol. She reports that she does not use illicit drugs.  family history includes Breast cancer in her paternal aunt; Crohn's disease in her sister; Diabetes in her paternal grandfather; Heart disease in her paternal grandfather; and Prostate cancer in her father.  There is no history of Colon cancer and Stomach cancer.  No  Known Allergies     PHYSICAL EXAMINATION: Vital signs: BP 102/62  Pulse 72  Ht 5' 3.75" (1.619 m)  Wt 145 lb (65.772 kg)  BMI 25.09 kg/m2 General: Well-developed, well-nourished, no acute distress HEENT: Sclerae are anicteric, conjunctiva pink. Oral mucosa intact Lungs: Clear Heart: Regular Abdomen: soft, nontender, nondistended, no obvious ascites, no peritoneal signs, normal bowel sounds. No organomegaly. Extremities: No edema Psychiatric: alert and oriented x3. Cooperative     ASSESSMENT:  #1. Crohn's colitis. Clinically stable on Azulfidine. Last colonoscopy 2013 as described   PLAN:  #1. Continue Azulfidine #2. Surveillance colonoscopy next year. Contact the office in the interim for any questions or problems. Ongoing general medical care with Dr. Reynaldo [name]

## 2013-01-02 NOTE — Patient Instructions (Addendum)
You will receive a letter reminding you of your colonoscopy for 01/2014.  Please follow up with Dr. Henrene Pastor as needed

## 2013-01-16 ENCOUNTER — Other Ambulatory Visit: Payer: Self-pay | Admitting: Internal Medicine

## 2013-02-20 ENCOUNTER — Encounter: Payer: Self-pay | Admitting: Cardiovascular Disease

## 2013-02-20 ENCOUNTER — Ambulatory Visit (INDEPENDENT_AMBULATORY_CARE_PROVIDER_SITE_OTHER): Payer: BC Managed Care – PPO | Admitting: Cardiovascular Disease

## 2013-02-20 VITALS — BP 130/86 | HR 70 | Wt 146.0 lb

## 2013-02-20 DIAGNOSIS — I10 Essential (primary) hypertension: Secondary | ICD-10-CM

## 2013-02-20 DIAGNOSIS — E7800 Pure hypercholesterolemia, unspecified: Secondary | ICD-10-CM

## 2013-02-20 DIAGNOSIS — Z954 Presence of other heart-valve replacement: Secondary | ICD-10-CM

## 2013-02-20 NOTE — Patient Instructions (Signed)
Your physician wants you to follow-up in:   [place] will receive a reminder letter in the mail two months in advance. If you don't receive a letter, please call our office to schedule the follow-up appointment. Your physician recommends that you continue on your current medications as directed. Please refer to the Current Medication list given to you today. Your physician has requested that you have an echocardiogram. Echocardiography is a painless test that uses sound waves to create images of your heart. It provides your doctor with information about the size and shape of your heart and how well your heart's chambers and valves are working. This procedure takes approximately one hour. There are no restrictions for this procedure. IN A YEAR

## 2013-02-20 NOTE — Progress Notes (Signed)
Patient ID: Brittany [name], female   DOB: [birth year], 56 y.o.   MRN: [id number] Brittany [name] is seen today in followup for her aortic valve replacement. By her relatively young age he chose a tissue valve. Her surgery was done in 2008. Echo 02/03/11 showed a mean gradient of 15 mmHg and peak of 26 mmHg. There was no periprosthetic leak. She is active with no SOB, palpitatoins chest pain or edema. Has chrones disease followed by Dr Henrene Pastor. Did not get SBE prophylaxis with last colonoscopy. Review of guidelines circ.ahajournals.org/cgi/reprint/CIRCULATIONAHA.701.410301  indicate this is appropriate  8/12  Study Conclusions  - Left ventricle: The cavity size was normal. Wall thickness was normal. The estimated ejection fraction was 60%. Wall motion was normal; there were no regional wall motion abnormalities. Left ventricular diastolic function parameters were normal. - Aortic valve: A 44m bioprosthesis was present. The leaflets are seen opening well. There is good function. - Mitral valve: Mild regurgitation.   Mean AV gradient 189mg and Peak 2616m  [facility] follows lipids  Crestor increased last year.    ROS: Denies fever, malais, weight loss, blurry vision, decreased visual acuity, cough, sputum, SOB, hemoptysis, pleuritic pain, palpitaitons, heartburn, abdominal pain, melena, lower extremity edema, claudication, or rash.  All other systems reviewed and negative  General: Affect appropriate Healthy:  appears stated age HEE74ormal Neck supple with no adenopathy JVP normal no bruits no thyromegaly Lungs clear with no wheezing and good diaphragmatic motion Heart:  S1/T1/Y3tolic  murmur, no AR and no  rub, gallop or click PMI normal Abdomen: benighn, BS positve, no tenderness, no AAA no bruit.  No HSM or HJR Distal pulses intact with no bruits No edema Neuro non-focal Skin warm and dry No muscular weakness   Current Outpatient Prescriptions  Medication Sig Dispense Refill  . aspirin  325 MG EC tablet Take 325 mg by mouth daily.        . Calcium-Vitamin D-Vitamin K (CALCIUM SOFT CHEWS PO) Take 1 tablet by mouth 2 (two) times daily.        . metoprolol tartrate (LOPRESSOR) 25 MG tablet Take 1 tablet (25 mg total) by mouth 2 (two) times daily.  60 tablet  12  . Multiple Vitamins-Minerals (CENTRUM) tablet Take 1 tablet by mouth daily.        . rosuvastatin (CRESTOR) 10 MG tablet Take 20 mg by mouth daily.       . sMarland KitchenlfaSALAzine (AZULFIDINE) 500 MG tablet Take 4 tablets by mouth twice daily  240 tablet  11   No current facility-administered medications for this visit.    Allergies  Review of patient's allergies indicates no known allergies.  Electrocardiogram:  SR rate 70  Normal ECG   Assessment and Plan

## 2013-02-20 NOTE — Assessment & Plan Note (Signed)
Valve sounds good.  Will do echo in a year as she has not had it imaged in 2 years currently

## 2013-02-20 NOTE — Assessment & Plan Note (Signed)
Well controlled.  Continue current medications and low sodium Dash type diet.    

## 2013-02-20 NOTE — Assessment & Plan Note (Signed)
Continue statin Due for labs with Dr [name] January.  Discussed fact that statin may help tissue valve last longer as well

## 2013-03-17 ENCOUNTER — Other Ambulatory Visit: Payer: Self-pay | Admitting: Internal Medicine

## 2013-04-20 ENCOUNTER — Other Ambulatory Visit: Payer: Self-pay

## 2013-05-12 ENCOUNTER — Other Ambulatory Visit: Payer: Self-pay | Admitting: Internal Medicine

## 2013-06-14 ENCOUNTER — Other Ambulatory Visit: Payer: Self-pay

## 2013-06-14 MED ORDER — METOPROLOL TARTRATE 25 MG PO TABS: 25.0000 mg | ORAL_TABLET | Freq: Two times a day (BID) | ORAL | Status: DC

## 2013-08-09 ENCOUNTER — Other Ambulatory Visit: Payer: Self-pay | Admitting: Internal Medicine

## 2013-10-09 ENCOUNTER — Other Ambulatory Visit: Payer: Self-pay | Admitting: Internal Medicine

## 2013-12-11 ENCOUNTER — Other Ambulatory Visit: Payer: Self-pay | Admitting: Internal Medicine

## 2013-12-27 ENCOUNTER — Encounter: Payer: Self-pay | Admitting: Internal Medicine

## 2014-01-11 ENCOUNTER — Other Ambulatory Visit: Payer: Self-pay

## 2014-01-11 MED ORDER — METOPROLOL TARTRATE 25 MG PO TABS: 25.0000 mg | ORAL_TABLET | Freq: Two times a day (BID) | ORAL | Status: DC

## 2014-01-31 ENCOUNTER — Other Ambulatory Visit: Payer: Self-pay

## 2014-02-09 ENCOUNTER — Ambulatory Visit: Payer: BC Managed Care – PPO | Admitting: Internal Medicine

## 2014-02-13 ENCOUNTER — Encounter: Payer: Self-pay | Admitting: Internal Medicine

## 2014-02-13 ENCOUNTER — Ambulatory Visit (INDEPENDENT_AMBULATORY_CARE_PROVIDER_SITE_OTHER): Payer: BC Managed Care – PPO | Admitting: Internal Medicine

## 2014-02-13 ENCOUNTER — Other Ambulatory Visit: Payer: Self-pay | Admitting: Internal Medicine

## 2014-02-13 ENCOUNTER — Telehealth: Payer: Self-pay | Admitting: Internal Medicine

## 2014-02-13 ENCOUNTER — Ambulatory Visit: Payer: BC Managed Care – PPO | Admitting: Internal Medicine

## 2014-02-13 VITALS — BP 118/74 | HR 72 | Ht 64.25 in | Wt 147.0 lb

## 2014-02-13 DIAGNOSIS — K5010 Crohn's disease of large intestine without complications: Secondary | ICD-10-CM

## 2014-02-13 DIAGNOSIS — K5190 Ulcerative colitis, unspecified, without complications: Secondary | ICD-10-CM

## 2014-02-13 MED ORDER — MOVIPREP 100 G PO SOLR: 1.0000 | Freq: Once | ORAL | Status: DC

## 2014-02-13 MED ORDER — SULFASALAZINE 500 MG PO TABS: ORAL_TABLET | ORAL | Status: DC

## 2014-02-13 NOTE — Patient Instructions (Signed)
We have sent the following medications to your pharmacy for you to pick up at your convenience:  Sulfasalazine  You have been scheduled for a colonoscopy. Please follow written instructions given to you at your visit today.  Please pick up your prep kit at the pharmacy within the next 1-3 days. If you use inhalers (even only as needed), please bring them with you on the day of your procedure. Your physician has requested that you go to www.startemmi.com and enter the access code given to you at your visit today. This web site gives a general overview about your procedure. However, you should still follow specific instructions given to you by our office regarding your preparation for the procedure.

## 2014-02-13 NOTE — Progress Notes (Signed)
HISTORY OF PRESENT ILLNESS:  Brittany [name] is a 58 y.o. female with aortic stenosis status post aortic valve replacement, hypertension, hyperlipidemia, and chronic Crohn's colitis for which she is followed in this office. She was last evaluated 01/02/2013. At that time she was asymptomatic on Azulfidine. She continues on Azulfidine 2 g daily. Her last surveillance colonoscopy was performed 02/03/2012. She was found to have a mild patchy colitis, mostly in the distal transverse and descending colon. Biopsies were taken throughout. Normal appearing colon was normal microscopically mildly other regions revealed chronic active colitis without dysplasia. Routine followup in 2 years recommended. She has had no interval medical problems since her last visit. GI review of systems is negative. If anything, her bowel movements have a slight tendency toward constipation. No bleeding, pain or weight loss.  REVIEW OF SYSTEMS:  All non-GI ROS negative upon review Past Medical History  Diagnosis Date  . Hypercholesterolemia   . Colitis   . Hypertension     Unspecified  . Anemia, unspecified   . Aortic stenosis   . Crohn's disease   . Dyslipidemia     Past Surgical History  Procedure Laterality Date  . Aortic valve replacement  2008  . Tubal ligation      Social History Brittany [name]  reports that she has never smoked. She has never used smokeless tobacco. She reports that she drinks alcohol. She reports that she does not use illicit drugs.  family history includes Breast cancer in her paternal aunt; Crohn's disease in her sister; Diabetes in her paternal grandfather; Heart disease in her paternal grandfather; Prostate cancer in her father. There is no history of Colon cancer or Stomach cancer.  No Known Allergies     PHYSICAL EXAMINATION: Vital signs: BP 118/74  Pulse 72  Ht 5' 4.25" (1.632 m)  Wt 147 lb (66.679 kg)  BMI 25.04 kg/m2 General: Well-developed, well-nourished, no acute  distress HEENT: Sclerae are anicteric, conjunctiva pink. Oral mucosa intact Lungs: Clear Heart: Regular Abdomen: soft, nontender, nondistended, no obvious ascites, no peritoneal signs, normal bowel sounds. No organomegaly. Extremities: No edema Psychiatric: alert and oriented x3. Cooperative    ASSESSMENT:  #1. Crohn's colitis. Long-standing. Clinically stable on Azulfidine. Last colonoscopy 2013 as described   PLAN:  #1. Continue Azulfidine. Refill #2. Surveillance colonoscopy.The nature of the procedure, as well as the risks, benefits, and alternatives were carefully and thoroughly reviewed with the patient. Ample time for discussion and questions allowed. The patient understood, was satisfied, and agreed to proceed. Movi prep prescribed. Patient instructed on its use

## 2014-02-14 NOTE — Telephone Encounter (Signed)
Reprinted new instructions reflecting changed procedure date, highlighting the changes.  Called patient to tell her I was mailing the new instructions.  Instructed her to please call if she had any questions.  Patient agreed.  Mailed new instructions.

## 2014-02-20 ENCOUNTER — Encounter: Payer: Self-pay | Admitting: Internal Medicine

## 2014-03-08 ENCOUNTER — Other Ambulatory Visit (HOSPITAL_COMMUNITY): Payer: Self-pay | Admitting: Cardiovascular Disease

## 2014-03-08 ENCOUNTER — Ambulatory Visit (INDEPENDENT_AMBULATORY_CARE_PROVIDER_SITE_OTHER): Payer: BC Managed Care – PPO | Admitting: Cardiovascular Disease

## 2014-03-08 ENCOUNTER — Other Ambulatory Visit: Payer: Self-pay

## 2014-03-08 ENCOUNTER — Ambulatory Visit (HOSPITAL_COMMUNITY): Payer: BC Managed Care – PPO | Attending: Internal Medicine

## 2014-03-08 VITALS — BP 134/78 | HR 60 | Ht 64.0 in | Wt 146.0 lb

## 2014-03-08 DIAGNOSIS — I10 Essential (primary) hypertension: Secondary | ICD-10-CM | POA: Diagnosis not present

## 2014-03-08 DIAGNOSIS — Z954 Presence of other heart-valve replacement: Secondary | ICD-10-CM

## 2014-03-08 DIAGNOSIS — E7800 Pure hypercholesterolemia, unspecified: Secondary | ICD-10-CM

## 2014-03-08 DIAGNOSIS — I359 Nonrheumatic aortic valve disorder, unspecified: Secondary | ICD-10-CM

## 2014-03-08 DIAGNOSIS — E785 Hyperlipidemia, unspecified: Secondary | ICD-10-CM | POA: Insufficient documentation

## 2014-03-08 NOTE — Progress Notes (Signed)
2D Echo completed. 03/08/2014

## 2014-03-08 NOTE — Assessment & Plan Note (Signed)
Cholesterol is at goal.  Continue current dose of statin and diet Rx.  No myalgias or side effects.  F/U  LFT's in 6 months. No results found for this basename: Addy   Labs with primary

## 2014-03-08 NOTE — Patient Instructions (Addendum)
Your physician wants you to follow-up in:  [place] will receive a reminder letter in the mail two months in advance. If you don't receive a letter, please call our office to schedule the follow-up appointment. Your physician recommends that you continue on your current medications as directed. Please refer to the Current Medication list given to you today.

## 2014-03-08 NOTE — Progress Notes (Signed)
Patient ID: Brittany [name], female   DOB: 03-15-1956, 58 y.o.   MRN: [id number] Murray is seen today in followup for her aortic valve replacement. By her relatively young age he chose a tissue valve. Her surgery was done in 2008. Echo 02/03/11 showed a mean gradient of 15 mmHg and peak of 26 mmHg. There was no periprosthetic leak. She is active with no SOB, palpitatoins chest pain or edema. Has chrones disease followed by Dr Henrene Pastor. Did not get SBE prophylaxis with last colonoscopy. Review of guidelines circ.ahajournals.org/cgi/reprint/CIRCULATIONAHA.498.264158  indicate this is appropriate  8/12  Study Conclusions  - Left ventricle: The cavity size was normal. Wall thickness was normal. The estimated ejection fraction was 60%. Wall motion was normal; there were no regional wall motion abnormalities. Left ventricular diastolic function parameters were normal. - Aortic valve: A 25m bioprosthesis was present. The leaflets are seen opening well. There is good function. - Mitral valve: Mild regurgitation.   Mean AV gradient 161mg and Peak 2625m   Review of echo done today shows no significant change  Mean AV gradient  10    And Peak  17 mmHg AroReynaldo Miniumllows lipids Crestor increased last year.     ROS: Denies fever, malais, weight loss, blurry vision, decreased visual acuity, cough, sputum, SOB, hemoptysis, pleuritic pain, palpitaitons, heartburn, abdominal pain, melena, lower extremity edema, claudication, or rash.  All other systems reviewed and negative  General: Affect appropriate Healthy:  appears stated age HEE33ormal Neck supple with no adenopathy JVP normal no bruits no thyromegaly Lungs clear with no wheezing and good diaphragmatic motion Heart:  S1/S2 SEM through AVR  , no rub, gallop or click PMI normal Abdomen: benighn, BS positve, no tenderness, no AAA no bruit.  No HSM or HJR Distal pulses intact with no bruits No edema Neuro non-focal Skin warm and dry No muscular  weakness   Current Outpatient Prescriptions  Medication Sig Dispense Refill  . aspirin 325 MG EC tablet Take 325 mg by mouth daily.        . Calcium-Vitamin D-Vitamin K (CALCIUM SOFT CHEWS PO) Take 1 tablet by mouth 2 (two) times daily.        . metoprolol tartrate (LOPRESSOR) 25 MG tablet Take 1 tablet (25 mg total) by mouth 2 (two) times daily.  60 tablet  2  . MOVIPREP 100 G SOLR Take 1 kit (200 g total) by mouth once.  1 kit  0  . Multiple Vitamins-Minerals (CENTRUM) tablet Take 1 tablet by mouth daily.        . rosuvastatin (CRESTOR) 10 MG tablet Take 20 mg by mouth daily.       . sMarland KitchenlfaSALAzine (AZULFIDINE) 500 MG tablet Take 4 tablets by mouth twice daily  240 tablet  11   No current facility-administered medications for this visit.    Allergies  Review of patient's allergies indicates no known allergies.  Electrocardiogram:  SR rate 70 normal ECG   Assessment and Plan

## 2014-03-08 NOTE — Assessment & Plan Note (Signed)
Fortunate that tissue valve is staying so well preserved given young age of implant in 2008  Will have to watch MR as it is mild now SBE prophylaxis.  Gradients stable and no pervalvular regurgitation

## 2014-03-08 NOTE — Assessment & Plan Note (Signed)
Well controlled.  Continue current medications and low sodium Dash type diet.    

## 2014-04-10 ENCOUNTER — Encounter: Payer: BC Managed Care – PPO | Admitting: Internal Medicine

## 2014-04-12 ENCOUNTER — Other Ambulatory Visit: Payer: Self-pay | Admitting: Cardiovascular Disease

## 2014-04-16 ENCOUNTER — Ambulatory Visit (AMBULATORY_SURGERY_CENTER): Payer: BC Managed Care – PPO | Admitting: Internal Medicine

## 2014-04-16 ENCOUNTER — Encounter: Payer: Self-pay | Admitting: Internal Medicine

## 2014-04-16 VITALS — BP 139/79 | HR 72 | Temp 97.6°F | Resp 13 | Ht 64.25 in | Wt 147.0 lb

## 2014-04-16 DIAGNOSIS — K529 Noninfective gastroenteritis and colitis, unspecified: Secondary | ICD-10-CM

## 2014-04-16 DIAGNOSIS — Z1211 Encounter for screening for malignant neoplasm of colon: Secondary | ICD-10-CM

## 2014-04-16 DIAGNOSIS — K5010 Crohn's disease of large intestine without complications: Secondary | ICD-10-CM

## 2014-04-16 MED ORDER — SODIUM CHLORIDE 0.9 % IV SOLN: 500.0000 mL | INTRAVENOUS | Status: DC

## 2014-04-16 NOTE — Progress Notes (Signed)
Called to room to assist during endoscopic procedure.  Patient ID and intended procedure confirmed with present staff. Received instructions for my participation in the procedure from the performing physician.  

## 2014-04-16 NOTE — Op Note (Signed)
[facility]  [address]. [place], [address]   COLONOSCOPY PROCEDURE REPORT  PATIENT: Brittany [name], Brittany [name]  MR#: [id number] BIRTHDATE: 03/09/1956 , 55  yrs. old GENDER: female ENDOSCOPIST: Eustace Quail, MD REFERRED OM:[id number] Program Recall PROCEDURE DATE:  04/16/2014 PROCEDURE:   Colonoscopy with biopsies First Screening Colonoscopy - Avg.  risk and is 50 yrs.  old or older - No.  Prior Negative Screening - Now for repeat screening. N/A  History of Adenoma - Now for follow-up colonoscopy & has been > or = to 3 yrs.  N/A  Polyps Removed Today? No.  Polyps Removed Today? No.  Recommend repeat exam, <10 yrs? Yes.  High risk (family or personal hx). ASA CLASS:   Class II INDICATIONS:follow up for  Long-standing Crohn's colitis. Last surveillance examination August 2013. MEDICATIONS: Monitored anesthesia care and Propofol 280 mg IV  DESCRIPTION OF PROCEDURE:   After the risks benefits and alternatives of the procedure were thoroughly explained, informed consent was obtained.  The digital rectal exam revealed no abnormalities of the rectum.   The LB [id number]  endoscope was introduced through the anus and advanced to the cecum, which was identified by both the appendix and ileocecal valve. No adverse events experienced.   The quality of the prep was excellent, using MoviPrep  The instrument was then slowly withdrawn as the colon was fully examined.   COLON FINDINGS: The colonoscope was advanced to the cecal tip.  The cecum and proximal half of the ascending colon appeared grossly normal.  Multiple biopsies taken..  The transverse colon and descending colon revealed a patchy colitis as manifested by erythema without ulceration or friability.  Multiple biopsies taken.  The sigmoid colon and rectum appeared grossly normal. Multiple biopsies taken.  Retroflexed views revealed no abnormalities. The time to cecum=3 minutes 55 seconds.  Withdrawal time=18  minutes 05 seconds.  The scope was withdrawn and the procedure completed. COMPLICATIONS: There were no immediate complications.  ENDOSCOPIC IMPRESSION: 1. Patchy colitis involving the transverse and descending colon as described. Similar to previous exam. 2. Otherwise normal colonoscopy  RECOMMENDATIONS: 1.  Await biopsy results 2.  Continue current medications 3.  Repeat Colonoscopy in 2 years, if no dysplasia on biopsies. Routine office follow-up in 1 year.  eSigned:  Eustace Quail, MD 04/16/2014 12:29 PM   cc: Burnard Bunting, MD and The Patient

## 2014-04-16 NOTE — Patient Instructions (Signed)
Biopsies taken today, repeat colonoscopy in 2 years, depending on results. Routine office follow up in 1 year.  Continue current medications. Call us with any questions or concerns. Thank you!  YOU HAD AN ENDOSCOPIC PROCEDURE TODAY AT [facility]: Refer to the procedure report that was given to you for any specific questions about what was found during the examination.  If the procedure report does not answer your questions, please call your gastroenterologist to clarify.  If you requested that your care partner not be given the details of your procedure findings, then the procedure report has been included in a sealed envelope for you to review at your convenience later.  YOU SHOULD EXPECT: Some feelings of bloating in the abdomen. Passage of more gas than usual.  Walking can help get rid of the air that was put into your GI tract during the procedure and reduce the bloating. If you had a lower endoscopy (such as a colonoscopy or flexible sigmoidoscopy) you may notice spotting of blood in your stool or on the toilet paper. If you underwent a bowel prep for your procedure, then you may not have a normal bowel movement for a few days.  DIET: Your first meal following the procedure should be a light meal and then it is ok to progress to your normal diet.  A half-sandwich or bowl of soup is an example of a good first meal.  Heavy or fried foods are harder to digest and may make you feel nauseous or bloated.  Likewise meals heavy in dairy and vegetables can cause extra gas to form and this can also increase the bloating.  Drink plenty of fluids but you should avoid alcoholic beverages for 24 hours.  ACTIVITY: Your care partner should take you home directly after the procedure.  You should plan to take it easy, moving slowly for the rest of the day.  You can resume normal activity the day after the procedure however you should NOT DRIVE or use heavy machinery for 24 hours (because of the  sedation medicines used during the test).    SYMPTOMS TO REPORT IMMEDIATELY: A gastroenterologist can be reached at any hour.  During normal business hours, 8:30 AM to 5:00 PM Monday through Friday, call [phone].  After hours and on weekends, please call the GI answering service at [phone] who will take a message and have the physician on call contact you.   Following lower endoscopy (colonoscopy or flexible sigmoidoscopy):  Excessive amounts of blood in the stool  Significant tenderness or worsening of abdominal pains  Swelling of the abdomen that is new, acute  Fever of 100F or higher  Following upper endoscopy (EGD)  Vomiting of blood or coffee ground material  New chest pain or pain under the shoulder blades  Painful or persistently difficult swallowing  New shortness of breath  Fever of 100F or higher  Black, tarry-looking stools  FOLLOW UP: If any biopsies were taken you will be contacted by phone or by letter within the next 1-3 weeks.  Call your gastroenterologist if you have not heard about the biopsies in 3 weeks.  Our staff will call the home number listed on your records the next business day following your procedure to check on you and address any questions or concerns that you may have at that time regarding the information given to you following your procedure. This is a courtesy call and so if there is no answer at the home number and  we have not heard from you through the emergency physician on call, we will assume that you have returned to your regular daily activities without incident.  SIGNATURES/CONFIDENTIALITY: You and/or your care partner have signed paperwork which will be entered into your electronic medical record.  These signatures attest to the fact that that the information above on your After Visit Summary has been reviewed and is understood.  Full responsibility of the confidentiality of this discharge information lies with you and/or your  care-partner.

## 2014-04-16 NOTE — Progress Notes (Signed)
Procedure ends, to recovery, report given and VSS. 

## 2014-04-17 ENCOUNTER — Telehealth: Payer: Self-pay | Admitting: *Deleted

## 2014-04-17 NOTE — Telephone Encounter (Signed)
  Follow up Call-  Call back number 04/16/2014 02/03/2012  Post procedure Call Back phone  # [phone] cell [phone]  Permission to leave phone message Yes Yes     Patient questions:  Do you have a fever, pain , or abdominal swelling? No. Pain Score  0 *  Have you tolerated food without any problems? Yes.    Have you been able to return to your normal activities? No.  Do you have any questions about your discharge instructions: Diet   No. Medications  No. Follow up visit  No.  Do you have questions or concerns about your Care? No.  Actions: * If pain score is 4 or above: No action needed, pain <4.

## 2014-04-19 ENCOUNTER — Encounter: Payer: Self-pay | Admitting: Internal Medicine

## 2014-10-15 ENCOUNTER — Other Ambulatory Visit: Payer: Self-pay | Admitting: Cardiovascular Disease

## 2014-11-13 ENCOUNTER — Other Ambulatory Visit: Payer: Self-pay | Admitting: Cardiovascular Disease

## 2014-12-13 ENCOUNTER — Other Ambulatory Visit: Payer: Self-pay | Admitting: Cardiovascular Disease

## 2015-01-13 ENCOUNTER — Other Ambulatory Visit: Payer: Self-pay | Admitting: Cardiovascular Disease

## 2015-01-14 ENCOUNTER — Other Ambulatory Visit: Payer: Self-pay

## 2015-01-14 MED ORDER — METOPROLOL TARTRATE 25 MG PO TABS: ORAL_TABLET | ORAL | Status: DC

## 2015-03-11 ENCOUNTER — Encounter: Payer: Self-pay | Admitting: Cardiovascular Disease

## 2015-03-11 NOTE — Progress Notes (Signed)
Patient ID: YILIN WEEDON, female   DOB: 1956-05-03, 59 y.o.   MRN: [id number] Sorah is seen today in followup for her aortic valve replacement. By her relatively young age he chose a tissue valve. Her surgery was done in 2008. Echo 02/03/11 showed a mean gradient of 15 mmHg and peak of 26 mmHg. There was no periprosthetic leak. She is active with no SOB, palpitatoins chest pain or edema. Has chrones disease followed by Dr Henrene Pastor. Did not get SBE prophylaxis with last colonoscopy. Review of guidelines circ.ahajournals.org/cgi/reprint/CIRCULATIONAHA.915.056979  indicate this is appropriate   03/08/14  Study Conclusions  - Left ventricle: The cavity size was normal. Wall thickness was normal. Systolic function was normal. The estimated ejection fraction was in the range of 55% to 60%. - Aortic valve: Normal appearing tissue AVR with no perivalvular regurgitation Stable gradients since 2014. - Mitral valve: There was mild regurgitation. - Atrial septum: No defect or patent foramen ovale was identified.  Mean AV gradient 10mHg and Peak 235mg   Review of echo done 03/08/14 shows no significant change  Mean AV gradient  10    And Peak  17 mmHg ArReynaldo Miniumollows lipids Crestor increased last year.     ROS: Denies fever, malais, weight loss, blurry vision, decreased visual acuity, cough, sputum, SOB, hemoptysis, pleuritic pain, palpitaitons, heartburn, abdominal pain, melena, lower extremity edema, claudication, or rash.  All other systems reviewed and negative  General: Affect appropriate Healthy:  appears stated age HE19normal Neck supple with no adenopathy JVP normal no bruits no thyromegaly Lungs clear with no wheezing and good diaphragmatic motion Heart:  S1/S2 SEM through AVR  , no rub, gallop or click PMI normal Abdomen: benighn, BS positve, no tenderness, no AAA no bruit.  No HSM or HJR Distal pulses intact with no bruits No edema Neuro non-focal Skin warm and dry No  muscular weakness   Current Outpatient Prescriptions  Medication Sig Dispense Refill  . aspirin 325 MG EC tablet Take 325 mg by mouth daily.      . cholecalciferol (VITAMIN D) 1000 UNITS tablet Take 1,000 Units by mouth daily.    . CRESTOR 40 MG tablet Take 40 mg by mouth daily.   10  . metoprolol tartrate (LOPRESSOR) 25 MG tablet TAKE 1 TABLET BY MOUTH 2 TIMES DAILY. 60 tablet 6  . Multiple Vitamins-Minerals (CENTRUM) tablet Take 1 tablet by mouth daily.      . Marland KitchenulfaSALAzine (AZULFIDINE) 500 MG tablet Take 4 tablets by mouth twice daily 240 tablet 11   No current facility-administered medications for this visit.    Allergies  Review of patient's allergies indicates no known allergies.  Electrocardiogram:  SR rate 70 normal ECG  03/12/15  SR rate 75  Normal   Assessment and Plan AVR: echo 02/2014 fine no AR continue SBE and 81 mg ASA Chol: on statin labs followed by Arinson Chrones: stable no flares on azulfidine  F/U with me in a year with echo

## 2015-03-12 ENCOUNTER — Encounter: Payer: Self-pay | Admitting: Cardiovascular Disease

## 2015-03-12 ENCOUNTER — Ambulatory Visit (INDEPENDENT_AMBULATORY_CARE_PROVIDER_SITE_OTHER): Payer: BLUE CROSS/BLUE SHIELD | Admitting: Cardiovascular Disease

## 2015-03-12 VITALS — BP 140/80 | HR 75 | Ht 64.0 in | Wt 150.0 lb

## 2015-03-12 DIAGNOSIS — Z952 Presence of prosthetic heart valve: Secondary | ICD-10-CM

## 2015-03-12 DIAGNOSIS — E7800 Pure hypercholesterolemia, unspecified: Secondary | ICD-10-CM

## 2015-03-12 DIAGNOSIS — Z954 Presence of other heart-valve replacement: Secondary | ICD-10-CM

## 2015-03-12 NOTE — Patient Instructions (Signed)
Medication Instructions:  Your physician recommends that you continue on your current medications as directed. Please refer to the Current Medication list given to you today.   Labwork: NONE  Testing/Procedures: Your physician has requested that you have an echocardiogram. Echocardiography is a painless test that uses sound waves to create images of your heart. It provides your doctor with information about the size and shape of your heart and how well your heart's chambers and valves are working. This procedure takes approximately one hour. There are no restrictions for this procedure. IN  A  YEAR   Follow-Up: Your physician wants you to follow-up in: [place] will receive a reminder letter in the mail two months in advance. If you don't receive a letter, please call our office to schedule the follow-up appointment.   Any Other Special Instructions Will Be Listed Below (If Applicable).

## 2015-03-13 ENCOUNTER — Other Ambulatory Visit: Payer: Self-pay | Admitting: Internal Medicine

## 2015-04-11 ENCOUNTER — Encounter: Payer: Self-pay | Admitting: Internal Medicine

## 2015-04-11 ENCOUNTER — Ambulatory Visit (INDEPENDENT_AMBULATORY_CARE_PROVIDER_SITE_OTHER): Payer: BLUE CROSS/BLUE SHIELD | Admitting: Internal Medicine

## 2015-04-11 VITALS — BP 110/70 | HR 72 | Ht 64.0 in | Wt 151.0 lb

## 2015-04-11 DIAGNOSIS — K5909 Other constipation: Secondary | ICD-10-CM

## 2015-04-11 DIAGNOSIS — K5010 Crohn's disease of large intestine without complications: Secondary | ICD-10-CM | POA: Diagnosis not present

## 2015-04-11 MED ORDER — SULFASALAZINE 500 MG PO TABS: 2000.0000 mg | ORAL_TABLET | Freq: Two times a day (BID) | ORAL | Status: DC

## 2015-04-11 NOTE — Patient Instructions (Addendum)
We have sent the following medications to your pharmacy for you to pick up at your convenience: Asulfadine  You may try Miralax or Glycolax for constipation

## 2015-04-11 NOTE — Progress Notes (Signed)
HISTORY OF PRESENT ILLNESS:  Brittany [name] is a 59 y.o. female with aortic stenosis status post aortic valve replacement, hypertension, hyperlipidemia, and chronic Crohn's colitis for which she is followed in this office. She was last evaluated 04/16/2014 when she underwent surveillance colonoscopy with biopsies. The examination revealed patchy colitis involving the transverse and descending colon. Biopsies in those areas revealed minimally active colitis (chronic) without dysplasia. Biopsies from grossly normal-appearing portions of the colon were normal. For her colitis she has been on Azulfidine 2 g twice a day for some time. Since her last visit she continues to feel well. No problems with diarrhea, abdominal pain, or rectal bleeding. She does report more issues with troubling constipation for which she takes stool softeners and fiber without significant results. GI review of systems is otherwise negative. She denies signs or symptoms suggesting extra intestinal manifestations of inflammatory bowel disease, as well . She reports continuing to have annual blood work with Dr. [name]. Last blood work February 2016. No abnormalities reported.  REVIEW OF SYSTEMS:  All non-GI ROS negative upon comprehensive review of all systems  Past Medical History  Diagnosis Date  . Hypercholesterolemia   . Colitis   . Hypertension     Unspecified  . Anemia, unspecified   . Aortic stenosis   . Crohn's disease ([place])   . Dyslipidemia   . Cancer ([place])     basal cell skin cancer from left ear Sept 2015  . Heart murmur     Past Surgical History  Procedure Laterality Date  . Aortic valve replacement  2008  . Tubal ligation    . Pylonidal cyst removed      Social History Brittany [name]  reports that she has never smoked. She has never used smokeless tobacco. She reports that she drinks alcohol. She reports that she does not use illicit drugs.  family history includes Breast cancer in her paternal  aunt; Crohn's disease in her sister; Diabetes in her paternal grandfather; Heart disease in her paternal grandfather; Prostate cancer in her father. There is no history of Colon cancer, Stomach cancer, Esophageal cancer, Pancreatic cancer, or Rectal cancer.  No Known Allergies     PHYSICAL EXAMINATION: Vital signs: BP 110/70 mmHg  Pulse 72  Ht 5' 4"  (1.626 m)  Wt 151 lb (68.493 kg)  BMI 25.91 kg/m2 General: Well-developed, well-nourished, no acute distress HEENT: Sclerae are anicteric, conjunctiva pink. Oral mucosa intact Chest: Well-healed median sternotomy scar Lungs: Clear Heart: Regular Abdomen: soft, nontender, nondistended, no obvious ascites, no peritoneal signs, normal bowel sounds. No organomegaly. Extremities: No clubbing cyanosis or edema Psychiatric: alert and oriented x3. Cooperative   ASSESSMENT:  #1. Crohn's colitis. Long-standing. Clinically stable on Azulfidine. Last colonoscopy 2015 as described. #2. Constipation. Functional  PLAN:  #1. Continue Azulfidine. Prescription refilled #2. Continue annual blood work with Dr. [name] #3. Surveillance colonoscopy next year. Patient aware #4. Recommended MiraLAX for constipation. We discussed the appropriate way to titrate the medication for desired effect

## 2015-04-16 ENCOUNTER — Other Ambulatory Visit: Payer: Self-pay | Admitting: Orthopedic Surgery

## 2015-06-16 HISTORY — PX: CYST REMOVAL HAND: SHX6279

## 2015-08-13 ENCOUNTER — Other Ambulatory Visit: Payer: Self-pay | Admitting: Cardiovascular Disease

## 2015-12-16 ENCOUNTER — Other Ambulatory Visit: Payer: Self-pay | Admitting: *Deleted

## 2015-12-16 MED ORDER — SULFASALAZINE 500 MG PO TABS: 2000.0000 mg | ORAL_TABLET | Freq: Two times a day (BID) | ORAL | Status: DC

## 2015-12-30 ENCOUNTER — Encounter: Payer: Self-pay | Admitting: Cardiovascular Disease

## 2016-02-13 ENCOUNTER — Other Ambulatory Visit: Payer: Self-pay | Admitting: Cardiovascular Disease

## 2016-03-13 ENCOUNTER — Ambulatory Visit: Payer: BLUE CROSS/BLUE SHIELD | Admitting: Cardiovascular Disease

## 2016-03-13 ENCOUNTER — Other Ambulatory Visit (HOSPITAL_COMMUNITY): Payer: BLUE CROSS/BLUE SHIELD

## 2016-03-16 ENCOUNTER — Ambulatory Visit: Payer: BLUE CROSS/BLUE SHIELD | Admitting: Cardiovascular Disease

## 2016-03-16 ENCOUNTER — Other Ambulatory Visit (HOSPITAL_COMMUNITY): Payer: BLUE CROSS/BLUE SHIELD

## 2016-04-13 ENCOUNTER — Ambulatory Visit (INDEPENDENT_AMBULATORY_CARE_PROVIDER_SITE_OTHER): Payer: BLUE CROSS/BLUE SHIELD | Admitting: Internal Medicine

## 2016-04-13 ENCOUNTER — Encounter: Payer: Self-pay | Admitting: Internal Medicine

## 2016-04-13 VITALS — BP 134/88 | HR 80 | Ht 64.25 in | Wt 149.0 lb

## 2016-04-13 DIAGNOSIS — K5010 Crohn's disease of large intestine without complications: Secondary | ICD-10-CM | POA: Diagnosis not present

## 2016-04-13 MED ORDER — NA SULFATE-K SULFATE-MG SULF 17.5-3.13-1.6 GM/177ML PO SOLN: 1.0000 | Freq: Once | ORAL | 0 refills | Status: AC

## 2016-04-13 MED ORDER — SULFASALAZINE 500 MG PO TABS: 2000.0000 mg | ORAL_TABLET | Freq: Two times a day (BID) | ORAL | 6 refills | Status: DC

## 2016-04-13 NOTE — Progress Notes (Signed)
HISTORY OF PRESENT ILLNESS:  Brittany [name] is a 60 y.o. female with aortic stenosis status post aortic valve replacement, hypertension, hyperlipidemia, and chronic Crohn's colitis. She was last evaluated in this office 04/11/2015. At that time she was doing well on Azulfidine. She follows up at this time as requested. Overall she has been stable. Slight tendency toward constipation. Minor bleeding on 1 occasion only. No abdominal pain. Tolerating medication. Last colonoscopy 2015. Due for surveillance.  REVIEW OF SYSTEMS:  All non-GI ROS negative upon review  Past Medical History:  Diagnosis Date  . Anemia, unspecified   . Aortic stenosis   . Basal cell carcinoma     left ear Sept 2015  . Colitis   . Crohn's disease ([place])   . Dyslipidemia   . Heart murmur   . Hypercholesterolemia   . Hypertension    Unspecified    Past Surgical History:  Procedure Laterality Date  . AORTIC VALVE REPLACEMENT  2008  . pylonidal cyst removed    . TUBAL LIGATION      Social History Brittany [name]  reports that she has never smoked. She has never used smokeless tobacco. She reports that she drinks alcohol. She reports that she does not use drugs.  family history includes Breast cancer in her paternal aunt; Crohn's disease in her sister; Diabetes in her paternal grandfather; Heart disease in her paternal grandfather; Prostate cancer in her father.  No Known Allergies     PHYSICAL EXAMINATION: Vital signs: BP 134/88 (BP Location: Left Arm, Patient Position: Sitting, Cuff Size: Normal)   Pulse 80   Ht 5' 4.25" (1.632 m)   Wt 149 lb (67.6 kg)   BMI 25.38 kg/m   Constitutional: generally well-appearing, no acute distress Psychiatric: alert and oriented x3, cooperative Eyes: extraocular movements intact, anicteric, conjunctiva pink Mouth: oral pharynx moist, no lesions Neck: supple no lymphadenopathy Cardiovascular: heart regular rate and rhythm, no murmur Lungs: clear to auscultation  bilaterally Abdomen: soft, nontender, nondistended, no obvious ascites, no peritoneal signs, normal bowel sounds, no organomegaly Rectal:Deferred until colonoscopy Extremities: no clubbing cyanosis or lower extremity edema bilaterally Skin: no lesions on visible extremities Neuro: No focal deficits. Cranial nerves intact  ASSESSMENT:  #1. Crohn's colitis. Long-standing. Clinically stable on Azulfidine. Last colonoscopy 2015  PLAN:  #1. Refill Azulfidine and continue #2. Surveillance colonoscopy.The nature of the procedure, as well as the risks, benefits, and alternatives were carefully and thoroughly reviewed with the patient. Ample time for discussion and questions allowed. The patient understood, was satisfied, and agreed to proceed.

## 2016-04-13 NOTE — Patient Instructions (Signed)
We have sent the following medications to your pharmacy for you to pick up at your convenience: Azulfadine  You have been scheduled for a colonoscopy. Please follow written instructions given to you at your visit today.  Please pick up your prep supplies at the pharmacy within the next 1-3 days. If you use inhalers (even only as needed), please bring them with you on the day of your procedure. Your physician has requested that you go to www.startemmi.com and enter the access code given to you at your visit today. This web site gives a general overview about your procedure. However, you should still follow specific instructions given to you by our office regarding your preparation for the procedure.

## 2016-04-18 ENCOUNTER — Other Ambulatory Visit: Payer: Self-pay | Admitting: Internal Medicine

## 2016-04-20 ENCOUNTER — Other Ambulatory Visit: Payer: Self-pay | Admitting: Internal Medicine

## 2016-04-26 NOTE — Progress Notes (Signed)
Patient ID: Brittany [name], female   DOB: [birth year], 60 y.o.   MRN: [id number] Brittany [name] is seen today in followup for her aortic valve replacement. By her relatively young age he chose a tissue valve. Her surgery was done in 2008. Echo 02/03/11 showed a mean gradient of 15 mmHg and peak of 26 mmHg. There was no periprosthetic leak. She is active with no SOB, palpitatoins chest pain or edema. Has chrones disease followed by Brittany [name]. Did not get SBE prophylaxis with last colonoscopy. Review of guidelines circ.ahajournals.org/cgi/reprint/CIRCULATIONAHA.591.638466  indicate this is appropriate   Echo 05/01/16 reviewed normal EF mean gradient 11 mmHg peak 18 mmHg No AR trivial MR stable since 2015   Review of echo done 03/08/14 shows no significant change  Mean AV gradient  10    And Peak  17 mmHg Brittany [name] follows lipids Crestor increased last year.   Echo:   ROS: Denies fever, malais, weight loss, blurry vision, decreased visual acuity, cough, sputum, SOB, hemoptysis, pleuritic pain, palpitaitons, heartburn, abdominal pain, melena, lower extremity edema, claudication, or rash.  All other systems reviewed and negative  General: Affect appropriate Healthy:  appears stated age 17: normal Neck supple with no adenopathy JVP normal no bruits no thyromegaly Lungs clear with no wheezing and good diaphragmatic motion Heart:  S1/S2 SEM through AVR  , no rub, gallop or click PMI normal Abdomen: benighn, BS positve, no tenderness, no AAA no bruit.  No HSM or HJR Distal pulses intact with no bruits No edema Neuro non-focal Skin warm and dry No muscular weakness   Current Outpatient Prescriptions  Medication Sig Dispense Refill  . aspirin 325 MG EC tablet Take 325 mg by mouth daily.      . cholecalciferol (VITAMIN D) 1000 UNITS tablet Take 1,000 Units by mouth daily.    . CRESTOR 40 MG tablet Take 40 mg by mouth daily.   10  . metoprolol tartrate (LOPRESSOR) 25 MG tablet TAKE 1 TABLET BY MOUTH 2  TIMES DAILY. 60 tablet 2  . Multiple Vitamins-Minerals (CENTRUM) tablet Take 1 tablet by mouth daily.      Marland Kitchen sulfaSALAzine (AZULFIDINE) 500 MG tablet Take 4 tablets (2,000 mg total) by mouth 2 (two) times daily. 240 tablet 6   No current facility-administered medications for this visit.     Allergies  Patient has no known allergies.  Electrocardiogram:  SR rate 70 normal ECG  03/12/15  SR rate 75  Normal  05/01/16 SR rate 74 LAE normal   Assessment and Plan AVR: echo 05/01/16  fine no AR continue SBE and 81 mg ASA   Chol: on statin labs followed by Brittany [name] Chrones: stable no flares on azulfidine  F/U with me in a year

## 2016-05-01 ENCOUNTER — Other Ambulatory Visit: Payer: Self-pay

## 2016-05-01 ENCOUNTER — Ambulatory Visit (INDEPENDENT_AMBULATORY_CARE_PROVIDER_SITE_OTHER): Payer: BLUE CROSS/BLUE SHIELD | Admitting: Cardiovascular Disease

## 2016-05-01 ENCOUNTER — Ambulatory Visit (HOSPITAL_COMMUNITY): Payer: BLUE CROSS/BLUE SHIELD | Attending: Internal Medicine

## 2016-05-01 ENCOUNTER — Other Ambulatory Visit: Payer: Self-pay | Admitting: Cardiovascular Disease

## 2016-05-01 ENCOUNTER — Encounter: Payer: Self-pay | Admitting: Cardiovascular Disease

## 2016-05-01 VITALS — BP 132/80 | HR 56 | Resp 16 | Ht 64.0 in | Wt 149.0 lb

## 2016-05-01 DIAGNOSIS — I359 Nonrheumatic aortic valve disorder, unspecified: Secondary | ICD-10-CM | POA: Diagnosis not present

## 2016-05-01 DIAGNOSIS — E7800 Pure hypercholesterolemia, unspecified: Secondary | ICD-10-CM | POA: Insufficient documentation

## 2016-05-01 DIAGNOSIS — Z952 Presence of prosthetic heart valve: Secondary | ICD-10-CM | POA: Diagnosis not present

## 2016-05-01 DIAGNOSIS — I119 Hypertensive heart disease without heart failure: Secondary | ICD-10-CM | POA: Insufficient documentation

## 2016-05-01 NOTE — Patient Instructions (Addendum)

## 2016-05-12 ENCOUNTER — Other Ambulatory Visit: Payer: Self-pay | Admitting: *Deleted

## 2016-05-12 MED ORDER — METOPROLOL TARTRATE 25 MG PO TABS: 25.0000 mg | ORAL_TABLET | Freq: Two times a day (BID) | ORAL | 11 refills | Status: DC

## 2016-05-20 ENCOUNTER — Other Ambulatory Visit: Payer: Self-pay | Admitting: Cardiovascular Disease

## 2016-05-21 ENCOUNTER — Encounter: Payer: Self-pay | Admitting: Internal Medicine

## 2016-05-28 ENCOUNTER — Ambulatory Visit (AMBULATORY_SURGERY_CENTER): Payer: BLUE CROSS/BLUE SHIELD | Admitting: Internal Medicine

## 2016-05-28 ENCOUNTER — Encounter: Payer: Self-pay | Admitting: Internal Medicine

## 2016-05-28 VITALS — BP 125/77 | HR 79 | Temp 98.0°F | Resp 14 | Ht 64.0 in | Wt 151.0 lb

## 2016-05-28 DIAGNOSIS — K5150 Left sided colitis without complications: Secondary | ICD-10-CM | POA: Diagnosis not present

## 2016-05-28 DIAGNOSIS — K5010 Crohn's disease of large intestine without complications: Secondary | ICD-10-CM

## 2016-05-28 DIAGNOSIS — K529 Noninfective gastroenteritis and colitis, unspecified: Secondary | ICD-10-CM | POA: Diagnosis not present

## 2016-05-28 MED ORDER — SODIUM CHLORIDE 0.9 % IV SOLN: 500.0000 mL | INTRAVENOUS | Status: DC

## 2016-05-28 NOTE — Op Note (Signed)
[facility] Patient Name: Brittany [name] Procedure Date: 05/28/2016 2:27 PM MRN: [id number] Endoscopist: Docia Chuck. Henrene Pastor , MD Age: 60 Referring MD:  Date of Birth: 1955-07-06 Gender: Female Account #: [id number] Procedure:                Colonoscopy, with multiple biopsies Indications:              High risk colon cancer surveillance: Crohn's                            colitis of 8 (or more) years duration. Last                            examination 2 years ago Medicines:                Monitored Anesthesia Care Procedure:                Pre-Anesthesia Assessment:                           - Prior to the procedure, a History and Physical                            was performed, and patient medications and                            allergies were reviewed. The patient's tolerance of                            previous anesthesia was also reviewed. The risks                            and benefits of the procedure and the sedation                            options and risks were discussed with the patient.                            All questions were answered, and informed consent                            was obtained. Prior Anticoagulants: The patient has                            taken no previous anticoagulant or antiplatelet                            agents. ASA Grade Assessment: II - A patient with                            mild systemic disease. After reviewing the risks                            and benefits, the patient was deemed in  satisfactory condition to undergo the procedure.                           After obtaining informed consent, the colonoscope                            was passed under direct vision. Throughout the                            procedure, the patient's blood pressure, pulse, and                            oxygen saturations were monitored continuously. The                            Model CF-HQ190L  [id number]) scope was introduced                            through the anus and advanced to the the cecum,                            identified by appendiceal orifice and ileocecal                            valve. The ileocecal valve, appendiceal orifice,                            and rectum were photographed. The quality of the                            bowel preparation was excellent. The colonoscopy                            was performed without difficulty. The patient                            tolerated the procedure well. The bowel preparation                            used was SUPREP. Scope In: 2:33:17 PM Scope Out: 2:52:39 PM Scope Withdrawal Time: 0 hours 15 minutes 28 seconds  Total Procedure Duration: 0 hours 19 minutes 22 seconds  Findings:                 The exam was otherwise without abnormality on                            direct and retroflexion views unless otherwise                            stated as below.                           The Simple Endoscopic Score for Crohn's Disease was  determined based on the endoscopic appearance of                            the mucosa in the following segments:                           - Ileum: not examined.                           - Right Colon and cecum: Findings include no ulcers                            present, no ulcerated surfaces, no affected                            surfaces and no narrowings. biopsied                           - Transverse Colon: Findings include no ulcers                            present, no ulcerated surfaces, less than 50% of                            surfaces affected and no narrowings(patchy mild                            colitis). biopsied                           - Left Colon: Findings include no ulcers present,                            no ulcerated surfaces, less than 50% of surfaces                            affected and no narrowings(patchy mild  colitis).                            biopsied                           - Rectum: Findings include no ulcers present, no                            ulcerated surfaces, no affected surfaces and no                            narrowings. Biopsies were taken with a cold forceps                            for histology.BIOPSIES taken were 4 quadrant                            approximately every 10 cm. Total of  32 biopsies                            obtained. Complications:            No immediate complications. Estimated blood loss:                            None. Estimated Blood Loss:     Estimated blood loss: none. Impression:               - The examination was otherwise normal on direct                            and retroflexion views is otherwise stated as below.                           - Mild Crohn's colitis with patchy inflammation                            from the proximal sigmoid colon to hepatic flexure. Recommendation:           - Repeat colonoscopy in 2 years for surveillance,                            if no dysplasia on biopsies.                           - Patient has a contact number available for                            emergencies. The signs and symptoms of potential                            delayed complications were discussed with the                            patient. Return to normal activities tomorrow.                            Written discharge instructions were provided to the                            patient.                           - Routine office follow-up with Dr. Henrene Pastor in 1                            year. Sooner if needed                           - Continue present medications.                           - Await pathology results. Docia Chuck. Henrene Pastor, MD 05/28/2016 3:02:20 PM This report has been signed electronically.

## 2016-05-28 NOTE — Patient Instructions (Signed)
Impression/Recommendations:  Repeat colonoscopy in 2 years for surveillance pending pathology results.  Follow up with Dr. Henrene Pastor in one year, sooner if needed.  YOU HAD AN ENDOSCOPIC PROCEDURE TODAY AT [facility]:   Refer to the procedure report that was given to you for any specific questions about what was found during the examination.  If the procedure report does not answer your questions, please call your gastroenterologist to clarify.  If you requested that your care partner not be given the details of your procedure findings, then the procedure report has been included in a sealed envelope for you to review at your convenience later.  YOU SHOULD EXPECT: Some feelings of bloating in the abdomen. Passage of more gas than usual.  Walking can help get rid of the air that was put into your GI tract during the procedure and reduce the bloating. If you had a lower endoscopy (such as a colonoscopy or flexible sigmoidoscopy) you may notice spotting of blood in your stool or on the toilet paper. If you underwent a bowel prep for your procedure, you may not have a normal bowel movement for a few days.  Please Note:  You might notice some irritation and congestion in your nose or some drainage.  This is from the oxygen used during your procedure.  There is no need for concern and it should clear up in a day or so.  SYMPTOMS TO REPORT IMMEDIATELY:   Following lower endoscopy (colonoscopy or flexible sigmoidoscopy):  Excessive amounts of blood in the stool  Significant tenderness or worsening of abdominal pains  Swelling of the abdomen that is new, acute  Fever of 100F or higher For urgent or emergent issues, a gastroenterologist can be reached at any hour by calling [phone].   DIET:  We do recommend a small meal at first, but then you may proceed to your regular diet.  Drink plenty of fluids but you should avoid alcoholic beverages for 24 hours.  ACTIVITY:  You should  plan to take it easy for the rest of today and you should NOT DRIVE or use heavy machinery until tomorrow (because of the sedation medicines used during the test).    FOLLOW UP: Our staff will call the number listed on your records the next business day following your procedure to check on you and address any questions or concerns that you may have regarding the information given to you following your procedure. If we do not reach you, we will leave a message.  However, if you are feeling well and you are not experiencing any problems, there is no need to return our call.  We will assume that you have returned to your regular daily activities without incident.  If any biopsies were taken you will be contacted by phone or by letter within the next 1-3 weeks.  Please call us at [phone] if you have not heard about the biopsies in 3 weeks.    SIGNATURES/CONFIDENTIALITY: You and/or your care partner have signed paperwork which will be entered into your electronic medical record.  These signatures attest to the fact that that the information above on your After Visit Summary has been reviewed and is understood.  Full responsibility of the confidentiality of this discharge information lies with you and/or your care-partner.

## 2016-05-28 NOTE — Progress Notes (Signed)
Report to PACU, RN, vss, BBS= Clear.  

## 2016-05-28 NOTE — Progress Notes (Signed)
Called to room to assist during endoscopic procedure.  Patient ID and intended procedure confirmed with present staff. Received instructions for my participation in the procedure from the performing physician.  

## 2016-05-29 ENCOUNTER — Telehealth: Payer: Self-pay

## 2016-05-29 NOTE — Telephone Encounter (Signed)
  Follow up Call-  Call back number 05/28/2016 04/16/2014  Post procedure Call Back phone  # [phone] cell  Permission to leave phone message Yes Yes  Some recent data might be hidden     Patient questions:  Do you have a fever, pain , or abdominal swelling? No. Pain Score  0 *  Have you tolerated food without any problems? Yes.    Have you been able to return to your normal activities? Yes.    Do you have any questions about your discharge instructions: Diet   No. Medications  No. Follow up visit  No.  Do you have questions or concerns about your Care? No.  Actions: * If pain score is 4 or above: No action needed, pain <4.

## 2016-06-03 ENCOUNTER — Encounter: Payer: Self-pay | Admitting: Internal Medicine

## 2016-11-11 ENCOUNTER — Other Ambulatory Visit: Payer: Self-pay | Admitting: Internal Medicine

## 2017-01-05 ENCOUNTER — Other Ambulatory Visit: Payer: Self-pay | Admitting: Internal Medicine

## 2017-03-06 ENCOUNTER — Other Ambulatory Visit: Payer: Self-pay | Admitting: Internal Medicine

## 2017-05-04 ENCOUNTER — Other Ambulatory Visit: Payer: Self-pay | Admitting: Cardiovascular Disease

## 2017-05-14 ENCOUNTER — Ambulatory Visit (INDEPENDENT_AMBULATORY_CARE_PROVIDER_SITE_OTHER): Payer: BLUE CROSS/BLUE SHIELD | Admitting: Internal Medicine

## 2017-05-14 ENCOUNTER — Encounter: Payer: Self-pay | Admitting: Internal Medicine

## 2017-05-14 VITALS — BP 138/80 | HR 72 | Ht 64.25 in | Wt 149.0 lb

## 2017-05-14 DIAGNOSIS — K50119 Crohn's disease of large intestine with unspecified complications: Secondary | ICD-10-CM

## 2017-05-14 NOTE — Progress Notes (Signed)
Patient ID: Brittany [name], female   DOB: 1955-07-13, 61 y.o.   MRN: [id number] Brittany [name] is seen today in followup for her aortic valve replacement. By her relatively young age he chose a tissue valve. Her surgery was done in 2008. Echo 02/03/11 showed a mean gradient of 15 mmHg and peak of 26 mmHg. There was no periprosthetic leak. She is active with no SOB, palpitatoins chest pain or edema. Has chrones disease followed by Brittany [name]. Did not get SBE prophylaxis with last colonoscopy. Review of guidelines circ.ahajournals.org/cgi/reprint/CIRCULATIONAHA.102.111735  indicate this is appropriate   Echo 05/01/16 reviewed normal EF mean gradient 11 mmHg peak 18 mmHg No AR trivial MR stable since 2015   Review of echo done 03/08/14 shows no significant change  Mean AV gradient  10    And Peak  17 mmHg Brittany [name] follows lipids Crestor increased last year.   ROS: Denies fever, malais, weight loss, blurry vision, decreased visual acuity, cough, sputum, SOB, hemoptysis, pleuritic pain, palpitaitons, heartburn, abdominal pain, melena, lower extremity edema, claudication, or rash.  All other systems reviewed and negative  General: BP 122/80   Pulse 74   Ht 5' 4"  (1.626 m)   Wt 147 lb 8 oz (66.9 kg)   SpO2 97%   BMI 25.32 kg/m  Affect appropriate Healthy:  appears stated age 19: normal Neck supple with no adenopathy JVP normal no bruits no thyromegaly Lungs clear with no wheezing and good diaphragmatic motion Heart:  S1/S2 SEM through AVR no AR  murmur, no rub, gallop or click PMI normal post sternotomy  Abdomen: benighn, BS positve, no tenderness, no AAA no bruit.  No HSM or HJR Distal pulses intact with no bruits No edema Neuro non-focal Skin warm and dry No muscular weakness   Current Outpatient Medications  Medication Sig Dispense Refill  . aspirin EC 81 MG tablet Take 81 mg by mouth daily.    . CRESTOR 40 MG tablet Take 40 mg by mouth daily.   10  . metoprolol tartrate (LOPRESSOR) 25 MG  tablet TAKE 1 TABLET(25 MG) BY MOUTH TWICE DAILY 60 tablet 0  . Multiple Vitamins-Minerals (CENTRUM) tablet Take 1 tablet by mouth daily.      Marland Kitchen sulfaSALAzine (AZULFIDINE) 500 MG tablet TAKE 4 TABLETS(2000 MG) BY MOUTH TWICE DAILY 240 tablet 3   Current Facility-Administered Medications  Medication Dose Route Frequency Provider Last Rate Last Dose  . 0.9 %  sodium chloride infusion  500 mL Intravenous Continuous Brittany Shipper, MD        Allergies  Patient has no known allergies.  Electrocardiogram:  SR rate 70 normal ECG  03/12/15  SR rate 75  Normal  05/01/16 SR rate 74 LAE normal 05/18/17 SR rate 67 normal  Assessment and Plan AVR: echo 05/01/16  fine no AR continue SBE and 81 mg ASA   Chol: on statin labs followed by Brittany [name] Chrones: stable no flares on azulfidine  F/U with me in a year

## 2017-05-14 NOTE — Patient Instructions (Signed)
Please follow up in one year

## 2017-05-14 NOTE — Progress Notes (Signed)
HISTORY OF PRESENT ILLNESS:  Brittany [name] is a 61 y.o. female with aortic stenosis status post aortic valve replacement, hypertension,, hyperlipidemia, and chronic Crohn's colitis for which she is followed in this office. Last office evaluation October 2017. Last complete colonoscopy December 2017. She was noted to have mild Crohn's colitis with patchy inflammation in the proximal sigmoid colon to the hepatic flexure. Multiple biopsies throughout revealed various changes ranging from benign colonic mucosa to patchy mildly active chronic colitis. No dysplasia. Follow-up in 2 years recommended. Patient continues on sulfasalazine 2 g twice daily. Since her last visit she reports no difficulties with bleeding, bowel habits, or abdominal pain. No interval medical problems. She requests medication refill. She continues her general medical care under the supervision of Dr. [name]. Annual checkups including blood work.  REVIEW OF SYSTEMS:  All non-GI ROS negative upon comprehensive review  Past Medical History:  Diagnosis Date  . Anemia, unspecified   . Aortic stenosis   . Basal cell carcinoma     left ear Sept 2015  . Colitis   . Crohn's disease ([place])   . Dyslipidemia   . Heart murmur   . Hypercholesterolemia   . Hypertension    Unspecified    Past Surgical History:  Procedure Laterality Date  . AORTIC VALVE REPLACEMENT  2008  . pylonidal cyst removed    . TUBAL LIGATION      Social History Brittany [name]  reports that  has never smoked. she has never used smokeless tobacco. She reports that she drinks alcohol. She reports that she does not use drugs.  family history includes Breast cancer in her paternal aunt; Crohn's disease in her sister; Diabetes in her paternal grandfather; Heart disease in her paternal grandfather; Prostate cancer in her father.  No Known Allergies     PHYSICAL EXAMINATION: Vital signs: BP 138/80 (BP Location: Left Arm, Patient Position: Sitting, Cuff  Size: Normal)   Pulse 72   Ht 5' 4.25" (1.632 m)   Wt 149 lb (67.6 kg)   BMI 25.38 kg/m   Constitutional: generally well-appearing, no acute distress Psychiatric: alert and oriented x3, cooperative Eyes: extraocular movements intact, anicteric, conjunctiva pink Mouth: oral pharynx moist, no lesions Neck: supple no lymphadenopathy Cardiovascular: heart regular rate and rhythm, systolic murmur Lungs: clear to auscultation bilaterally Abdomen: soft, nontender, nondistended, no obvious ascites, no peritoneal signs, normal bowel sounds, no organomegaly Rectal: Omitted Extremities: no clubbing, cyanosis, or lower extremity edema bilaterally Skin: no lesions on visible extremities Neuro: No focal deficits. Cranial nerves intact  ASSESSMENT:  #1. Chronic Crohn's colitis. Long-standing. Last colonoscopy December 2017 with minimal inflammation. Clinically quiescent. #2. Multiple general medical problems. Stable   PLAN:  #1. Continue sulfasalazine 2 g twice a day. Refilled #2. Due for surveillance colonoscopy next year #3. Contact the office in the interim for any questions or problems. She agrees  #4. Ongoing general medical care including general laboratory profile with Dr. [name]

## 2017-05-18 ENCOUNTER — Encounter (INDEPENDENT_AMBULATORY_CARE_PROVIDER_SITE_OTHER): Payer: Self-pay

## 2017-05-18 ENCOUNTER — Encounter: Payer: Self-pay | Admitting: Cardiovascular Disease

## 2017-05-18 ENCOUNTER — Ambulatory Visit (INDEPENDENT_AMBULATORY_CARE_PROVIDER_SITE_OTHER): Payer: 59 | Admitting: Cardiovascular Disease

## 2017-05-18 VITALS — BP 122/80 | HR 74 | Ht 64.0 in | Wt 147.5 lb

## 2017-05-18 DIAGNOSIS — Z952 Presence of prosthetic heart valve: Secondary | ICD-10-CM | POA: Diagnosis not present

## 2017-05-18 NOTE — Patient Instructions (Addendum)

## 2017-06-06 ENCOUNTER — Other Ambulatory Visit: Payer: Self-pay | Admitting: Cardiovascular Disease

## 2017-07-07 ENCOUNTER — Other Ambulatory Visit: Payer: Self-pay | Admitting: Internal Medicine

## 2017-08-02 DIAGNOSIS — E7849 Other hyperlipidemia: Secondary | ICD-10-CM | POA: Diagnosis not present

## 2017-08-02 DIAGNOSIS — I10 Essential (primary) hypertension: Secondary | ICD-10-CM | POA: Diagnosis not present

## 2017-08-02 DIAGNOSIS — Z0000 Encounter for general adult medical examination without abnormal findings: Secondary | ICD-10-CM | POA: Diagnosis not present

## 2017-08-02 DIAGNOSIS — R82998 Other abnormal findings in urine: Secondary | ICD-10-CM | POA: Diagnosis not present

## 2017-08-09 DIAGNOSIS — Z0000 Encounter for general adult medical examination without abnormal findings: Secondary | ICD-10-CM | POA: Diagnosis not present

## 2017-08-09 DIAGNOSIS — Z1389 Encounter for screening for other disorder: Secondary | ICD-10-CM | POA: Diagnosis not present

## 2017-08-09 DIAGNOSIS — L729 Follicular cyst of the skin and subcutaneous tissue, unspecified: Secondary | ICD-10-CM | POA: Diagnosis not present

## 2017-08-09 DIAGNOSIS — M47812 Spondylosis without myelopathy or radiculopathy, cervical region: Secondary | ICD-10-CM | POA: Diagnosis not present

## 2017-09-01 DIAGNOSIS — Z01419 Encounter for gynecological examination (general) (routine) without abnormal findings: Secondary | ICD-10-CM | POA: Diagnosis not present

## 2017-11-04 ENCOUNTER — Other Ambulatory Visit: Payer: Self-pay | Admitting: Internal Medicine

## 2018-02-07 DIAGNOSIS — K5090 Crohn's disease, unspecified, without complications: Secondary | ICD-10-CM | POA: Diagnosis not present

## 2018-02-07 DIAGNOSIS — E7849 Other hyperlipidemia: Secondary | ICD-10-CM | POA: Diagnosis not present

## 2018-02-07 DIAGNOSIS — I10 Essential (primary) hypertension: Secondary | ICD-10-CM | POA: Diagnosis not present

## 2018-02-07 DIAGNOSIS — Z23 Encounter for immunization: Secondary | ICD-10-CM | POA: Diagnosis not present

## 2018-03-25 DIAGNOSIS — Z6825 Body mass index (BMI) 25.0-25.9, adult: Secondary | ICD-10-CM | POA: Diagnosis not present

## 2018-03-25 DIAGNOSIS — I10 Essential (primary) hypertension: Secondary | ICD-10-CM | POA: Diagnosis not present

## 2018-05-16 NOTE — Progress Notes (Signed)
Patient ID: Brittany [name], female   DOB: 1955/11/19, 62 y.o.   MRN: [id number]     62 y.o. female  seen today in followup for her aortic valve replacement. Had bioprosthetic valve at young age . Her surgery was done in 2008. Observes SBE prophylaxis No chest pain , dyspnea, syncope palpitations or edema  Reviewed last TTE done 04/2016 EF 60-65% no AR mean gradient 11 mmHg peak 19 mmHg DVI .58 Gradients no change since 2015  No complaints Seeing dentist twice/year Discussed having TTE next year at 3 year mark    ROS: Denies fever, malais, weight loss, blurry vision, decreased visual acuity, cough, sputum, SOB, hemoptysis, pleuritic pain, palpitaitons, heartburn, abdominal pain, melena, lower extremity edema, claudication, or rash.  All other systems reviewed and negative  General: BP 116/76   Pulse 66   Ht 5' 3.75" (1.619 m)   Wt 149 lb (67.6 kg)   BMI 25.78 kg/m  Affect appropriate Healthy:  appears stated age 39: normal Neck supple with no adenopathy JVP normal no bruits no thyromegaly Lungs clear with no wheezing and good diaphragmatic motion Heart:  S1/S2 SEM through AVR no AR  murmur, no rub, gallop or click PMI normal post sternotomy  Abdomen: benighn, BS positve, no tenderness, no AAA no bruit.  No HSM or HJR Distal pulses intact with no bruits No edema Neuro non-focal Skin warm and dry No muscular weakness   Current Outpatient Medications  Medication Sig Dispense Refill  . aspirin EC 81 MG tablet Take 81 mg by mouth daily.    . CRESTOR 40 MG tablet Take 40 mg by mouth daily.   10  . metoprolol tartrate (LOPRESSOR) 25 MG tablet TAKE 1 TABLET(25 MG) BY MOUTH TWICE DAILY 60 tablet 11  . Multiple Vitamins-Minerals (CENTRUM) tablet Take 1 tablet by mouth daily.      Marland Kitchen sulfaSALAzine (AZULFIDINE) 500 MG tablet TAKE 4 TABLETS(2000 MG) BY MOUTH TWICE DAILY 240 tablet 6   Current Facility-Administered Medications  Medication Dose Route Frequency Provider Last Rate Last  Dose  . 0.9 %  sodium chloride infusion  500 mL Intravenous Continuous Brittany Shipper, MD        Allergies  Patient has no known allergies.  Electrocardiogram:  SR rate 70 normal ECG  03/12/15  SR rate 75  Normal  05/01/16 SR rate 74 LAE normal 05/18/17 SR rate 67 normal  Assessment and Plan  AVR: echo 05/01/16  fine no AR continue SBE and 81 mg ASA  F/u TTE next year  HLD: : on statin labs followed by Brittany [name]: stable no flares on azulfidine  F/U with me in a year  With TTE continue SBE prophylaxis

## 2018-05-18 ENCOUNTER — Encounter (INDEPENDENT_AMBULATORY_CARE_PROVIDER_SITE_OTHER): Payer: Self-pay

## 2018-05-18 ENCOUNTER — Ambulatory Visit (INDEPENDENT_AMBULATORY_CARE_PROVIDER_SITE_OTHER): Payer: 59 | Admitting: Cardiovascular Disease

## 2018-05-18 VITALS — BP 116/76 | HR 66 | Ht 63.75 in | Wt 149.0 lb

## 2018-05-18 DIAGNOSIS — Z952 Presence of prosthetic heart valve: Secondary | ICD-10-CM | POA: Diagnosis not present

## 2018-05-18 NOTE — Patient Instructions (Signed)
Medication Instructions:   If you need a refill on your cardiac medications before your next appointment, please call your pharmacy.   Lab work:  If you have labs (blood work) drawn today and your tests are completely normal, you will receive your results only by: Marland Kitchen MyChart Message (if you have MyChart) OR . A paper copy in the mail If you have any lab test that is abnormal or we need to change your treatment, we will call you to review the results.  Testing/Procedures: Your physician has requested that you have an echocardiogram in one year. Echocardiography is a painless test that uses sound waves to create images of your heart. It provides your doctor with information about the size and shape of your heart and how well your heart's chambers and valves are working. This procedure takes approximately one hour. There are no restrictions for this procedure.  Follow-Up: At [facility], you and your health needs are our priority.  As part of our continuing mission to provide you with exceptional heart care, we have created designated Provider Care Teams.  These Care Teams include your primary Cardiologist (physician) and Advanced Practice Providers (APPs -  Physician Assistants and Nurse Practitioners) who all work together to provide you with the care you need, when you need it. You will need a follow up appointment in 1 years.  Please call our office 2 months in advance to schedule this appointment.  You may see [name], MD or one of the following Advanced Practice Providers on your designated Care Team:   Truitt Merle, NP Cecilie Kicks, NP . Kathyrn Drown, NP

## 2018-05-20 DIAGNOSIS — L918 Other hypertrophic disorders of the skin: Secondary | ICD-10-CM | POA: Diagnosis not present

## 2018-05-20 DIAGNOSIS — Z85828 Personal history of other malignant neoplasm of skin: Secondary | ICD-10-CM | POA: Diagnosis not present

## 2018-05-20 DIAGNOSIS — D2262 Melanocytic nevi of left upper limb, including shoulder: Secondary | ICD-10-CM | POA: Diagnosis not present

## 2018-05-25 ENCOUNTER — Ambulatory Visit: Payer: 59 | Admitting: Internal Medicine

## 2018-05-30 ENCOUNTER — Ambulatory Visit (INDEPENDENT_AMBULATORY_CARE_PROVIDER_SITE_OTHER): Payer: 59 | Admitting: Internal Medicine

## 2018-05-30 ENCOUNTER — Encounter: Payer: Self-pay | Admitting: Internal Medicine

## 2018-05-30 VITALS — BP 138/70 | HR 78 | Ht 63.5 in | Wt 150.0 lb

## 2018-05-30 DIAGNOSIS — K5010 Crohn's disease of large intestine without complications: Secondary | ICD-10-CM | POA: Diagnosis not present

## 2018-05-30 MED ORDER — SULFASALAZINE 500 MG PO TABS: ORAL_TABLET | ORAL | 6 refills | Status: DC

## 2018-05-30 MED ORDER — NA SULFATE-K SULFATE-MG SULF 17.5-3.13-1.6 GM/177ML PO SOLN: 1.0000 | Freq: Once | ORAL | 0 refills | Status: AC

## 2018-05-30 NOTE — Progress Notes (Signed)
HISTORY OF PRESENT ILLNESS:  Brittany [name] is a 62 y.o. female with aortic stenosis status post aortic valve replacement, hypertension, hyperlipidemia, and chronic Crohn's colitis for which she is followed in this office.  Last office visit May 14, 2017.  She is maintained on sulfasalazine 2 g twice daily.  Her last surveillance colonoscopy May 28, 2016.  She was found to have mild patchy colitis without dysplasia.  Follow-up in 2 years recommended.  Patient presents today for routine office follow-up.  She continues on her medication.  She describes normal regular bowel habits.  No pain or bleeding.  Blood work with Dr. [name] has been unremarkable.  REVIEW OF SYSTEMS:  All non-GI ROS negative unless otherwise stated in the HPI except for headaches  Past Medical History:  Diagnosis Date  . Anemia, unspecified   . Aortic stenosis   . Basal cell carcinoma     left ear Sept 2015  . Colitis   . Crohn's disease ([place])   . Dyslipidemia   . Heart murmur   . Hypercholesterolemia   . Hypertension    Unspecified    Past Surgical History:  Procedure Laterality Date  . AORTIC VALVE REPLACEMENT  2008  . pylonidal cyst removed    . TUBAL LIGATION      Social History Brittany [name]  reports that she has never smoked. She has never used smokeless tobacco. She reports current alcohol use. She reports that she does not use drugs.  family history includes Alzheimer's disease in her mother; Breast cancer in her paternal aunt; Crohn's disease in her sister; Diabetes in her paternal grandfather; Heart attack in her paternal grandfather; Heart disease in her paternal grandfather; Hypertension in her father; Prostate cancer in her father.  No Known Allergies     PHYSICAL EXAMINATION: Vital signs: BP 138/70   Pulse 78   Ht 5' 3.5" (1.613 m) Comment: no shoes  Wt 150 lb (68 kg)   BMI 26.15 kg/m   Constitutional: generally well-appearing, no acute distress Psychiatric: alert and  oriented x3, cooperative Eyes: extraocular movements intact, anicteric, conjunctiva pink Mouth: oral pharynx moist, no lesions Neck: supple no lymphadenopathy Cardiovascular: heart regular rate and rhythm,  systolic murmur Lungs: clear to auscultation bilaterally Abdomen: soft, nontender, nondistended, no obvious ascites, no peritoneal signs, normal bowel sounds, no organomegaly Rectal: Deferred to colonoscopy Extremities: no clubbing, cyanosis, or lower extremity edema bilaterally Skin: no lesions on visible extremities Neuro: No focal deficits.  Cranial nerves intact  ASSESSMENT:  1.  Longstanding Crohn's colitis.  On sulfasalazine.  Clinically asymptomatic.  Last colonoscopy 2 years ago as described   PLAN:  1.  Continue sulfasalazine 2 g twice daily.  Prescription refilled 2.  Schedule surveillance colonoscopy with biopsies.The nature of the procedure, as well as the risks, benefits, and alternatives were carefully and thoroughly reviewed with the patient. Ample time for discussion and questions allowed. The patient understood, was satisfied, and agreed to proceed.

## 2018-05-30 NOTE — Patient Instructions (Addendum)
We have sent the following medications to your pharmacy for you to pick up at your convenience:  sulfasalazine   You have been scheduled for a colonoscopy. Please follow written instructions given to you at your visit today.  Please pick up your prep supplies at the pharmacy within the next 1-3 days. If you use inhalers (even only as needed), please bring them with you on the day of your procedure. Your physician has requested that you go to www.startemmi.com and enter the access code given to you at your visit today. This web site gives a general overview about your procedure. However, you should still follow specific instructions given to you by our office regarding your preparation for the procedure.

## 2018-05-31 ENCOUNTER — Other Ambulatory Visit: Payer: Self-pay | Admitting: Cardiovascular Disease

## 2018-05-31 ENCOUNTER — Other Ambulatory Visit: Payer: Self-pay | Admitting: Internal Medicine

## 2018-06-13 ENCOUNTER — Encounter: Payer: 59 | Admitting: Internal Medicine

## 2018-07-05 ENCOUNTER — Ambulatory Visit (AMBULATORY_SURGERY_CENTER): Payer: 59 | Admitting: Internal Medicine

## 2018-07-05 ENCOUNTER — Encounter: Payer: Self-pay | Admitting: Internal Medicine

## 2018-07-05 VITALS — BP 106/63 | HR 78 | Temp 98.4°F | Resp 14 | Ht 63.5 in | Wt 150.0 lb

## 2018-07-05 DIAGNOSIS — K529 Noninfective gastroenteritis and colitis, unspecified: Secondary | ICD-10-CM

## 2018-07-05 DIAGNOSIS — K635 Polyp of colon: Secondary | ICD-10-CM

## 2018-07-05 DIAGNOSIS — K5010 Crohn's disease of large intestine without complications: Secondary | ICD-10-CM | POA: Diagnosis not present

## 2018-07-05 DIAGNOSIS — Z1211 Encounter for screening for malignant neoplasm of colon: Secondary | ICD-10-CM | POA: Diagnosis not present

## 2018-07-05 DIAGNOSIS — D122 Benign neoplasm of ascending colon: Secondary | ICD-10-CM

## 2018-07-05 DIAGNOSIS — K5150 Left sided colitis without complications: Secondary | ICD-10-CM

## 2018-07-05 MED ORDER — SODIUM CHLORIDE 0.9 % IV SOLN: 500.0000 mL | Freq: Once | INTRAVENOUS | Status: DC

## 2018-07-05 NOTE — Progress Notes (Signed)
Called to room to assist during endoscopic procedure.  Patient ID and intended procedure confirmed with present staff. Received instructions for my participation in the procedure from the performing physician.  

## 2018-07-05 NOTE — Patient Instructions (Signed)
Thank you for allowing [place] to care for you today!  Await biopsy results, approximately 2 weeks.  Recommendation for next colonoscopy will be made at that time.  Follow up with Dr Henrene Pastor in one year for follow up.  Resume previous diet and medications today.  Return to normal activities tomorrow.     YOU HAD AN ENDOSCOPIC PROCEDURE TODAY AT [facility]:   Refer to the procedure report that was given to you for any specific questions about what was found during the examination.  If the procedure report does not answer your questions, please call your gastroenterologist to clarify.  If you requested that your care partner not be given the details of your procedure findings, then the procedure report has been included in a sealed envelope for you to review at your convenience later.  YOU SHOULD EXPECT: Some feelings of bloating in the abdomen. Passage of more gas than usual.  Walking can help get rid of the air that was put into your GI tract during the procedure and reduce the bloating. If you had a lower endoscopy (such as a colonoscopy or flexible sigmoidoscopy) you may notice spotting of blood in your stool or on the toilet paper. If you underwent a bowel prep for your procedure, you may not have a normal bowel movement for a few days.  Please Note:  You might notice some irritation and congestion in your nose or some drainage.  This is from the oxygen used during your procedure.  There is no need for concern and it should clear up in a day or so.  SYMPTOMS TO REPORT IMMEDIATELY:   Following lower endoscopy (colonoscopy or flexible sigmoidoscopy):  Excessive amounts of blood in the stool  Significant tenderness or worsening of abdominal pains  Swelling of the abdomen that is new, acute  Fever of 100F or higher  For urgent or emergent issues, a gastroenterologist can be reached at any hour by calling [phone].   DIET:  We do recommend a small meal at first, but then  you may proceed to your regular diet.  Drink plenty of fluids but you should avoid alcoholic beverages for 24 hours.  ACTIVITY:  You should plan to take it easy for the rest of today and you should NOT DRIVE or use heavy machinery until tomorrow (because of the sedation medicines used during the test).    FOLLOW UP: Our staff will call the number listed on your records the next business day following your procedure to check on you and address any questions or concerns that you may have regarding the information given to you following your procedure. If we do not reach you, we will leave a message.  However, if you are feeling well and you are not experiencing any problems, there is no need to return our call.  We will assume that you have returned to your regular daily activities without incident.  If any biopsies were taken you will be contacted by phone or by letter within the next 1-3 weeks.  Please call us at [phone] if you have not heard about the biopsies in 3 weeks.    SIGNATURES/CONFIDENTIALITY: You and/or your care partner have signed paperwork which will be entered into your electronic medical record.  These signatures attest to the fact that that the information above on your After Visit Summary has been reviewed and is understood.  Full responsibility of the confidentiality of this discharge information lies with you and/or your care-partner.

## 2018-07-05 NOTE — Progress Notes (Signed)
Alert and oriented x3, pleased with MAC, report to RN  

## 2018-07-05 NOTE — Op Note (Signed)
[facility] Patient Name: Brittany [name] Procedure Date: 07/05/2018 11:39 AM MRN: [id number] Endoscopist: Docia Chuck. Henrene Pastor , MD Age: 63 Referring MD:  Date of Birth: Aug 15, 1955 Gender: Female Account #: [id number] Procedure:                Colonoscopy with cold snare polypectomy x 1; with                            biopsies Indications:              High risk colon cancer surveillance: Crohn's                            disease large intestine, High risk colon cancer                            surveillance: Crohn's colitis of 8 (or more) years                            duration. Last examination December 2017 Medicines:                Monitored Anesthesia Care Procedure:                Pre-Anesthesia Assessment:                           - Prior to the procedure, a History and Physical                            was performed, and patient medications and                            allergies were reviewed. The patient's tolerance of                            previous anesthesia was also reviewed. The risks                            and benefits of the procedure and the sedation                            options and risks were discussed with the patient.                            All questions were answered, and informed consent                            was obtained. Prior Anticoagulants: The patient has                            taken no previous anticoagulant or antiplatelet                            agents. ASA Grade Assessment: II - A patient with  mild systemic disease. After reviewing the risks                            and benefits, the patient was deemed in                            satisfactory condition to undergo the procedure.                           After obtaining informed consent, the colonoscope                            was passed under direct vision. Throughout the                            procedure, the patient's blood  pressure, pulse, and                            oxygen saturations were monitored continuously. The                            Colonoscope was introduced through the anus and                            advanced to the the cecum, identified by                            appendiceal orifice and ileocecal valve. The                            ileocecal valve, appendiceal orifice, and rectum                            were photographed. The quality of the bowel                            preparation was excellent. The colonoscopy was                            performed without difficulty. The patient tolerated                            the procedure well. The bowel preparation used was                            SUPREP. Scope In: 11:50:42 AM Scope Out: 12:13:32 PM Scope Withdrawal Time: 0 hours 18 minutes 14 seconds  Total Procedure Duration: 0 hours 22 minutes 50 seconds  Findings:                 A 5 mm polyp was found in the ascending colon. The                            polyp was removed with a cold snare. Resection and  retrieval were complete.                           The colonic mucosa revealed patchy colitis                            throughout as manifested by erythema and slight                            mucosal irregularity. No ulcers or spontaneous                            hemorrhage. The most proximal colon and rectum were                            without inflammation. Four-quadrant biopsies were                            taken about every 10 cm throughout and submitted in                            4 separate containers.                           The colon was otherwise normal on direct and                            retroflexed views. Complications:            No immediate complications. Estimated blood loss:                            None. Estimated Blood Loss:     Estimated blood loss: none. Impression:               - One 5 mm polyp in  the ascending colon, removed                            with a cold snare. Resected and retrieved.                           - Mild Crohn's colitis with active patchy                            inflammation. Biopsied. Recommendation:           - Repeat colonoscopy in 2 years for surveillance if                            no dysplasia on biopsies.                           - Patient has a contact number available for                            emergencies. The signs and symptoms of potential  delayed complications were discussed with the                            patient. Return to normal activities tomorrow.                            Written discharge instructions were provided to the                            patient.                           - Resume previous diet.                           - Continue present medications.                           - Await pathology results.                           - Routine office follow-up with Dr. Henrene Pastor in 1                            year. Sooner if needed [employer]. Henrene Pastor, MD 07/05/2018 12:23:30 PM This report has been signed electronically.

## 2018-07-06 ENCOUNTER — Telehealth: Payer: Self-pay

## 2018-07-06 NOTE — Telephone Encounter (Signed)
  Follow up Call-  Call back number 07/05/2018 05/28/2016  Post procedure Call Back phone  # [phone]  Permission to leave phone message Yes Yes  Some recent data might be hidden     Patient questions:  Do you have a fever, pain , or abdominal swelling? No. Pain Score  0 *  Have you tolerated food without any problems? Yes.    Have you been able to return to your normal activities? Yes.    Do you have any questions about your discharge instructions: Diet   No. Medications  No. Follow up visit  No.  Do you have questions or concerns about your Care? No.  Actions: * If pain score is 4 or above: No action needed, pain <4.

## 2018-07-07 ENCOUNTER — Encounter: Payer: Self-pay | Admitting: Internal Medicine

## 2018-08-02 ENCOUNTER — Encounter: Payer: Self-pay | Admitting: Internal Medicine

## 2018-08-09 DIAGNOSIS — E7849 Other hyperlipidemia: Secondary | ICD-10-CM | POA: Diagnosis not present

## 2018-08-09 DIAGNOSIS — R82998 Other abnormal findings in urine: Secondary | ICD-10-CM | POA: Diagnosis not present

## 2018-08-09 DIAGNOSIS — I10 Essential (primary) hypertension: Secondary | ICD-10-CM | POA: Diagnosis not present

## 2018-08-09 DIAGNOSIS — Z0000 Encounter for general adult medical examination without abnormal findings: Secondary | ICD-10-CM | POA: Diagnosis not present

## 2018-08-10 DIAGNOSIS — E7849 Other hyperlipidemia: Secondary | ICD-10-CM | POA: Diagnosis not present

## 2018-08-10 DIAGNOSIS — I10 Essential (primary) hypertension: Secondary | ICD-10-CM | POA: Diagnosis not present

## 2018-08-10 DIAGNOSIS — Z0000 Encounter for general adult medical examination without abnormal findings: Secondary | ICD-10-CM | POA: Diagnosis not present

## 2018-08-10 DIAGNOSIS — I358 Other nonrheumatic aortic valve disorders: Secondary | ICD-10-CM | POA: Diagnosis not present

## 2018-09-07 ENCOUNTER — Ambulatory Visit: Payer: 59 | Admitting: Internal Medicine

## 2019-01-09 ENCOUNTER — Other Ambulatory Visit: Payer: Self-pay | Admitting: Internal Medicine

## 2019-03-07 ENCOUNTER — Other Ambulatory Visit: Payer: Self-pay | Admitting: Internal Medicine

## 2019-03-07 DIAGNOSIS — R519 Headache, unspecified: Secondary | ICD-10-CM

## 2019-03-07 DIAGNOSIS — G44009 Cluster headache syndrome, unspecified, not intractable: Secondary | ICD-10-CM

## 2019-03-10 ENCOUNTER — Other Ambulatory Visit: Payer: Self-pay

## 2019-03-10 ENCOUNTER — Ambulatory Visit
Admission: RE | Admit: 2019-03-10 | Discharge: 2019-03-10 | Disposition: A | Discharge status: Home / Self Care | Payer: 59 | Source: Ambulatory Visit | Attending: Internal Medicine | Admitting: Internal Medicine

## 2019-03-10 DIAGNOSIS — G44009 Cluster headache syndrome, unspecified, not intractable: Secondary | ICD-10-CM

## 2019-03-10 DIAGNOSIS — R519 Headache, unspecified: Secondary | ICD-10-CM

## 2019-03-10 MED ORDER — GADOBENATE DIMEGLUMINE 529 MG/ML IV SOLN
13.0000 mL | Freq: Once | INTRAVENOUS | Status: AC | PRN
  Administered 2019-03-10: 13 mL via INTRAVENOUS

## 2019-04-25 NOTE — Progress Notes (Signed)
Patient ID: Brittany [name], female   DOB: 26-Feb-1956, 63 y.o.   MRN: [id number]     64 y.o. female  seen today in followup for her aortic valve replacement. Had bioprosthetic valve at young age . Her surgery was done in 2008. Observes SBE prophylaxis No chest pain , dyspnea, syncope palpitations or edema  Reviewed last TTE done 04/2016 EF 60-65% no AR mean gradient 11 mmHg peak 19 mmHg DVI .58  Echo reviewed 05/01/19 peak velocity up to 2.8 with mean gradient 16 and peak 32 mmHG also mild MR and mild LVH   No complaints Seeing dentist twice/year    ROS: Denies fever, malais, weight loss, blurry vision, decreased visual acuity, cough, sputum, SOB, hemoptysis, pleuritic pain, palpitaitons, heartburn, abdominal pain, melena, lower extremity edema, claudication, or rash.  All other systems reviewed and negative  General: BP (!) 146/82   Pulse 66   Ht 5' 3.5" (1.613 m)   Wt 149 lb (67.6 kg)   SpO2 98%   BMI 25.98 kg/m  Affect appropriate Healthy:  appears stated age 5: normal Neck supple with no adenopathy JVP normal no bruits no thyromegaly Lungs clear with no wheezing and good diaphragmatic motion Heart:  S1/S2 SEM through AVR no AR  murmur, no rub, gallop or click PMI normal post sternotomy  Abdomen: benighn, BS positve, no tenderness, no AAA no bruit.  No HSM or HJR Distal pulses intact with no bruits No edema Neuro non-focal Skin warm and dry No muscular weakness   Current Outpatient Medications  Medication Sig Dispense Refill  . aspirin EC 81 MG tablet Take 81 mg by mouth daily.    . CRESTOR 40 MG tablet Take 40 mg by mouth daily.   10  . metoprolol tartrate (LOPRESSOR) 25 MG tablet TAKE 1 TABLET(25 MG) BY MOUTH TWICE DAILY 60 tablet 11  . Multiple Vitamins-Minerals (CENTRUM) tablet Take 1 tablet by mouth daily.      Marland Kitchen sulfaSALAzine (AZULFIDINE) 500 MG tablet TAKE 4 TABLETS(2000 MG) BY MOUTH TWICE DAILY 240 tablet 6   No current facility-administered medications  for this visit.     Allergies  Patient has no known allergies.  Electrocardiogram:  05/01/19 SR rate 66 LAE  Assessment and Plan  AVR: echo today 05/01/19 with more elevated gradients but expected over 3 years No AR EF normal and asymptomatic f/u echo in a year She has a 21 mm bovine tissue valve CE Model 3000 placed by [facility] July 7/ 2008  HLD: : on statin labs followed by [name]: stable no flares on azulfidine  F/U with me in a year    [employer]

## 2019-05-01 ENCOUNTER — Encounter: Payer: Self-pay | Admitting: Cardiovascular Disease

## 2019-05-01 ENCOUNTER — Ambulatory Visit (INDEPENDENT_AMBULATORY_CARE_PROVIDER_SITE_OTHER): Payer: 59 | Admitting: Cardiovascular Disease

## 2019-05-01 ENCOUNTER — Other Ambulatory Visit: Payer: Self-pay

## 2019-05-01 ENCOUNTER — Ambulatory Visit (HOSPITAL_COMMUNITY): Payer: 59 | Attending: Cardiovascular Disease

## 2019-05-01 VITALS — BP 146/82 | HR 66 | Ht 63.5 in | Wt 149.0 lb

## 2019-05-01 DIAGNOSIS — Z952 Presence of prosthetic heart valve: Secondary | ICD-10-CM | POA: Diagnosis not present

## 2019-05-01 DIAGNOSIS — E785 Hyperlipidemia, unspecified: Secondary | ICD-10-CM | POA: Diagnosis not present

## 2019-05-01 NOTE — Progress Notes (Signed)
[facility]    Provider:  Dr Jaynee Eagles Requesting Provider: Burnard Bunting, MD Primary Care Provider:  Burnard Bunting, MD  CC:  Headaches  HPI:  Brittany [name] is a 63 y.o. female here as requested by Burnard Bunting, MD for headaches. PMHx headaches, HTN, hypercholesterolemia, murmur, hld, crohn's disease, aortic stenosis, anemia.  I reviewed Dr. Harley Hallmark notes, patient complained of frequent headaches, intermittent, however they will last for weeks at a time.  Symptoms are in the back of the head, left temple behind the eye, denies nausea and vomiting or vision changes.  Headaches are slightly improved when he last saw her.  They discussed cluster headaches, questionable if it is related to her neck, he ordered an MRI of the brain which was normal.  Examination was nonfocal.  He checked a sed rate and CRP which were unremarkable, CRP less than 1, sed rate 35.  She does have cervical spondylosis. Headaches started several years ago, never had significant headaches in the past but started having aura's in college, she still gets it occassionally and takes advil and it stops the headache. Her recent headaches on the left side of the head at the temple area, pounding, pulsating no significant light sensitivity or sound sensitivity or nausea. She last had it in august and September. She would have them every few months but it would last several weeks on and off. Not severe, moderate, wouldn't really affect life but was concerning. Unknown triggers. She gets her eyes checked. advil takes care of it.   Reviewed notes, labs and imaging from outside physicians, which showed: I reviewed labs from Dr. Harley Hallmark request.  February 17, 2019: CRP less than 1 which is negative, CBC unremarkable except for slightly elevated MCV 99.6, sed rate was 35.  I reviewed MRI of the brain images from March 10, 2019, which was normal, age-appropriate cerebral volume, no masses, no infarcts, no  mass or any acute intracranial abnormality.   Review of Systems: Patient complains of symptoms per HPI as well as the following symptoms:headache. Pertinent negatives and positives per HPI. All others negative.   Social History   Socioeconomic History  . Marital status: Married    Spouse name: Not on file  . Number of children: 2  . Years of education: Not on file  . Highest education level: Not on file  Occupational History  . Occupation: Manufacturing engineer: [employer]  Social Needs  . Financial resource strain: Not on file  . Food insecurity    Worry: Not on file    Inability: Not on file  . Transportation needs    Medical: Not on file    Non-medical: Not on file  Tobacco Use  . Smoking status: Never Smoker  . Smokeless tobacco: Never Used  Substance and Sexual Activity  . Alcohol use: Yes    Alcohol/week: 0.0 standard drinks    Comment: 3 per week, sometimes a daily glass of wine  . Drug use: No  . Sexual activity: Not on file  Lifestyle  . Physical activity    Days per week: Not on file    Minutes per session: Not on file  . Stress: Not on file  Relationships  . Social Herbalist on phone: Not on file    Gets together: Not on file    Attends religious service: Not on file    Active member of club or organization: Not on file    Attends meetings of  clubs or organizations: Not on file    Relationship status: Not on file  . Intimate partner violence    Fear of current or ex partner: Not on file    Emotionally abused: Not on file    Physically abused: Not on file    Forced sexual activity: Not on file  Other Topics Concern  . Not on file  Social History Narrative   Daily caffeine use: 1 cup of coffee, occasional coke or tea (not daily)   Pt gets regular exercise by walking 30 minutes/day about 3 times per week   Right handed   Lives at home with husband    Family History  Problem Relation Age of Onset  . Prostate cancer Father   .  Hypertension Father   . Alzheimer's disease Mother   . Headache Mother        "sick headaches"  . Crohn's disease Sister   . Breast cancer Paternal Aunt   . Diabetes Paternal Grandfather   . Heart disease Paternal Grandfather   . Heart attack Paternal Grandfather   . Alzheimer's disease Maternal Grandmother   . Pancreatic cancer Maternal Grandfather   . Colon cancer Neg Hx   . Stomach cancer Neg Hx   . Esophageal cancer Neg Hx   . Rectal cancer Neg Hx     Past Medical History:  Diagnosis Date  . Anemia, unspecified    pt doesn't feel this is a recurrent issue for her  . Aortic stenosis   . Basal cell carcinoma     left ear Sept 2015  . Colitis   . Crohn's disease ([place])   . Dyslipidemia   . Heart murmur   . Hypercholesterolemia   . Hypertension    Unspecified    Patient Active Problem List   Diagnosis Date Noted  . Migraine with aura and with status migrainosus, not intractable 05/02/2019  . Migraine without aura and with status migrainosus, not intractable 05/02/2019  . HYPERCHOLESTEROLEMIA  IIA 09/08/2008  . HYPERTENSION, UNSPECIFIED 09/08/2008  . COLITIS 09/08/2008  . AORTIC VALVE REPLACEMENT, HX OF 09/08/2008  . ANEMIA-UNSPECIFIED 06/11/2008  . DYSLIPIDEMIA 08/04/2007  . AORTIC STENOSIS 08/04/2007  . CROHN'S DISEASE 08/04/2007    Past Surgical History:  Procedure Laterality Date  . AORTIC VALVE REPLACEMENT  2008  . CYST REMOVAL HAND Right 2017   right index finger   . pylonidal cyst removed    . TUBAL LIGATION      Current Outpatient Medications  Medication Sig Dispense Refill  . aspirin EC 81 MG tablet Take 81 mg by mouth daily.    . CRESTOR 40 MG tablet Take 40 mg by mouth daily.   10  . metoprolol tartrate (LOPRESSOR) 25 MG tablet TAKE 1 TABLET(25 MG) BY MOUTH TWICE DAILY 60 tablet 11  . Multiple Vitamins-Minerals (CENTRUM) tablet Take 1 tablet by mouth daily.      Marland Kitchen sulfaSALAzine (AZULFIDINE) 500 MG tablet TAKE 4 TABLETS(2000 MG) BY MOUTH TWICE  DAILY 240 tablet 6  . rizatriptan (MAXALT-MLT) 10 MG disintegrating tablet Take 1 tablet (10 mg total) by mouth as needed for migraine. May repeat in 2 hours if needed 9 tablet 11   No current facility-administered medications for this visit.     Allergies as of 05/02/2019  . (No Known Allergies)    Vitals: BP (!) 148/92 (BP Location: Left Arm, Patient Position: Sitting)   Pulse 76   Temp (!) 96.8 F (36 C) Comment: taken at front door  Ht 5' 3.5" (1.613 m)   Wt 150 lb (68 kg)   BMI 26.15 kg/m  Last Weight:  Wt Readings from Last 1 Encounters:  05/02/19 150 lb (68 kg)   Last Height:   Ht Readings from Last 1 Encounters:  05/02/19 5' 3.5" (1.613 m)     Physical exam: Exam: Gen: NAD, conversant, well nourised, well groomed                     CV: RRR, no MRG. No Carotid Bruits. No peripheral edema, warm, nontender Eyes: Conjunctivae clear without exudates or hemorrhage  Neuro: Detailed Neurologic Exam  Speech:    Speech is normal; fluent and spontaneous with normal comprehension.  Cognition:    The patient is oriented to person, place, and time;     recent and remote memory intact;     language fluent;     normal attention, concentration,     fund of knowledge Cranial Nerves:    The pupils are equal, round, and reactive to light. The fundi are normal and spontaneous venous pulsations are present. Visual fields are full to finger confrontation. Extraocular movements are intact. Trigeminal sensation is intact and the muscles of mastication are normal. The face is symmetric. The palate elevates in the midline. Hearing intact. Voice is normal. Shoulder shrug is normal. The tongue has normal motion without fasciculations.   Coordination:    No dysmetria  Gait:    Normal native gait  Motor Observation:    No asymmetry, no atrophy, and no involuntary movements noted. Tone:    Normal muscle tone.    Posture:    Posture is normal. normal erect    Strength:     Strength is V/V in the upper and lower limbs.      Sensation: intact to LT     Reflex Exam:  DTR's:    Deep tendon reflexes in the upper and lower extremities are normal bilaterally.   Toes:    The toes are downgoing bilaterally.   Clonus:    Clonus is absent.    Assessment/Plan: This is a very nice patient with a history of migraines in the past, over the last 2 years having episodic migraines that can be moderately severe and can last upwards of 2 weeks.  She also has auras and can abort the headache if she takes Advil right when she sees her visual changes.  She does not have the headaches often however and her last headache was several months ago although when she does have them it lasts a long time.  We discussed preventative medication which I do not think she needs right now, it appears she needs a very good acute regimen so that when she does have the headaches with or without aura she can effectively terminate the migraine so it does not turn into a 2-week battle.  -At the onset of aura or any headache, we will start a triptan.  Take Maxalt and then can repeat in 2 hours.  We discussed the importance of taking this right at the onset.  Also take it with an Advil as this also helps not only abort the headache but avoid the migraine rebounding when the Maxalt wears off.  We also discussed steroids, infusions, other medications such as the new G pants (such as Nurtec, Ubrelvy), we also discussed preventative medications for her information.  I did provide her with literature to read about migraines and migraine auras and pathophysiology and genetic  predispositions.  We also discussed auras and increased risk of stroke and contraindication for hormone replacement therapy.  These migraines may have worsened in the setting of ongoing menopausal symptoms per patient.  -I advised patient to follow-up as needed however if she does get into the situation where she has an ongoing headache and the  Maxalt did not help she should contact us so we can discuss other options for termination, infusions, Toradol shots or other things we can do in the clinic and then also change management of acute headaches to see if we can get something that does work.   To prevent or relieve headaches, try the following: Cool Compress. Lie down and place a cool compress on your head.  Avoid headache triggers. If certain foods or odors seem to have triggered your migraines in the past, avoid them. A headache diary might help you identify triggers.  Include physical activity in your daily routine. Try a daily walk or other moderate aerobic exercise.  Manage stress. Find healthy ways to cope with the stressors, such as delegating tasks on your to-do list.  Practice relaxation techniques. Try deep breathing, yoga, massage and visualization.  Eat regularly. Eating regularly scheduled meals and maintaining a healthy diet might help prevent headaches. Also, drink plenty of fluids.  Follow a regular sleep schedule. Sleep deprivation might contribute to headaches Consider biofeedback. With this mind-body technique, you learn to control certain bodily functions - such as muscle tension, heart rate and blood pressure - to prevent headaches or reduce headache pain.    Proceed to emergency room if you experience new or worsening symptoms or symptoms do not resolve, if you have new neurologic symptoms or if headache is severe, or for any concerning symptom.   Provided education and documentation from American headache Society toolbox including articles on: chronic migraine medication overuse headache, chronic migraines, prevention of migraines, behavioral and other nonpharmacologic treatments for headache.   No orders of the defined types were placed in this encounter.  Meds ordered this encounter  Medications  . rizatriptan (MAXALT-MLT) 10 MG disintegrating tablet    Sig: Take 1 tablet (10 mg total) by mouth as needed  for migraine. May repeat in 2 hours if needed    Dispense:  9 tablet    Refill:  11    Cc: Burnard Bunting, MD  Sarina Ill, MD  [facility] [address] [place], [place] [address]  Phone [phone] Fax [phone]

## 2019-05-01 NOTE — Patient Instructions (Addendum)
Medication Instructions:   *If you need a refill on your cardiac medications before your next appointment, please call your pharmacy*  Lab Work:  If you have labs (blood work) drawn today and your tests are completely normal, you will receive your results only by: Marland Kitchen MyChart Message (if you have MyChart) OR . A paper copy in the mail If you have any lab test that is abnormal or we need to change your treatment, we will call you to review the results.  Testing/Procedures: Your physician has requested that you have an echocardiogram in one year. Echocardiography is a painless test that uses sound waves to create images of your heart. It provides your doctor with information about the size and shape of your heart and how well your heart's chambers and valves are working. This procedure takes approximately one hour. There are no restrictions for this procedure.  Follow-Up: At [facility], you and your health needs are our priority.  As part of our continuing mission to provide you with exceptional heart care, we have created designated Provider Care Teams.  These Care Teams include your primary Cardiologist (physician) and Advanced Practice Providers (APPs -  Physician Assistants and Nurse Practitioners) who all work together to provide you with the care you need, when you need it.  Your next appointment:   12 months  The format for your next appointment:   In Person  Provider:   You may see [name], MD or one of the following Advanced Practice Providers on your designated Care Team:    Truitt Merle, NP  Cecilie Kicks, NP  Kathyrn Drown, NP

## 2019-05-02 ENCOUNTER — Ambulatory Visit (INDEPENDENT_AMBULATORY_CARE_PROVIDER_SITE_OTHER): Payer: 59 | Admitting: Neurology

## 2019-05-02 ENCOUNTER — Encounter: Payer: Self-pay | Admitting: Neurology

## 2019-05-02 VITALS — BP 148/92 | HR 76 | Temp 96.8°F | Ht 63.5 in | Wt 150.0 lb

## 2019-05-02 DIAGNOSIS — G43109 Migraine with aura, not intractable, without status migrainosus: Secondary | ICD-10-CM

## 2019-05-02 DIAGNOSIS — G43101 Migraine with aura, not intractable, with status migrainosus: Secondary | ICD-10-CM | POA: Insufficient documentation

## 2019-05-02 DIAGNOSIS — G43001 Migraine without aura, not intractable, with status migrainosus: Secondary | ICD-10-CM | POA: Insufficient documentation

## 2019-05-02 MED ORDER — RIZATRIPTAN BENZOATE 10 MG PO TBDP: 10.0000 mg | ORAL_TABLET | ORAL | 11 refills | Status: DC | PRN

## 2019-05-02 NOTE — Patient Instructions (Signed)
Maxalt(Rizatriptan): Please take one tablet at the onset of your headache. If it does not improve the symptoms please take one additional tablet. Do not take more then 2 tablets in 24hrs. Do not take use more then 2 to 3 times in a week.  Rizatriptan tablets What is this medicine? RIZATRIPTAN (rye za TRIP tan) is used to treat migraines with or without aura. An aura is a strange feeling or visual disturbance that warns you of an attack. It is not used to prevent migraines. This medicine may be used for other purposes; ask your health care provider or pharmacist if you have questions. COMMON BRAND NAME(S): Maxalt What should I tell my health care provider before I take this medicine? They need to know if you have any of these conditions:  cigarette smoker  circulation problems in fingers and toes  diabetes  heart disease  high blood pressure  high cholesterol  history of irregular heartbeat  history of stroke  kidney disease  liver disease  stomach or intestine problems  an unusual or allergic reaction to rizatriptan, other medicines, foods, dyes, or preservatives  pregnant or trying to get pregnant  breast-feeding How should I use this medicine? Take this medicine by mouth with a glass of water. Follow the directions on the prescription label. Do not take it more often than directed. Talk to your pediatrician regarding the use of this medicine in children. While this drug may be prescribed for children as young as 6 years for selected conditions, precautions do apply. Overdosage: If you think you have taken too much of this medicine contact a poison control center or emergency room at once. NOTE: This medicine is only for you. Do not share this medicine with others. What if I miss a dose? This does not apply. This medicine is not for regular use. What may interact with this medicine? Do not take this medicine with any of the following medicines:  certain medicines for  migraine headache like almotriptan, eletriptan, frovatriptan, naratriptan, rizatriptan, sumatriptan, zolmitriptan  ergot alkaloids like dihydroergotamine, ergonovine, ergotamine, methylergonovine  MAOIs like Carbex, Eldepryl, Marplan, Nardil, and Parnate This medicine may also interact with the following medications:  certain medicines for depression, anxiety, or psychotic disorders  propranolol This list may not describe all possible interactions. Give your health care provider a list of all the medicines, herbs, non-prescription drugs, or dietary supplements you use. Also tell them if you smoke, drink alcohol, or use illegal drugs. Some items may interact with your medicine. What should I watch for while using this medicine? Visit your healthcare professional for regular checks on your progress. Tell your healthcare professional if your symptoms do not start to get better or if they get worse. You may get drowsy or dizzy. Do not drive, use machinery, or do anything that needs mental alertness until you know how this medicine affects you. Do not stand up or sit up quickly, especially if you are an older patient. This reduces the risk of dizzy or fainting spells. Alcohol may interfere with the effect of this medicine. Your mouth may get dry. Chewing sugarless gum or sucking hard candy and drinking plenty of water may help. Contact your healthcare professional if the problem does not go away or is severe. If you take migraine medicines for 10 or more days a month, your migraines may get worse. Keep a diary of headache days and medicine use. Contact your healthcare professional if your migraine attacks occur more frequently. What side effects may  I notice from receiving this medicine? Side effects that you should report to your doctor or health care professional as soon as possible:  allergic reactions like skin rash, itching or hives, swelling of the face, lips, or tongue  chest pain or chest  tightness  signs and symptoms of a dangerous change in heartbeat or heart rhythm like chest pain; dizziness; fast, irregular heartbeat; palpitations; feeling faint or lightheaded; falls; breathing problems  signs and symptoms of a stroke like changes in vision; confusion; trouble speaking or understanding; severe headaches; sudden numbness or weakness of the face, arm or leg; trouble walking; dizziness; loss of balance or coordination  signs and symptoms of serotonin syndrome like irritable; confusion; diarrhea; fast or irregular heartbeat; muscle twitching; stiff muscles; trouble walking; sweating; high fever; seizures; chills; vomiting Side effects that usually do not require medical attention (report to your doctor or health care professional if they continue or are bothersome):  diarrhea  dizziness  drowsiness  dry mouth  headache  nausea, vomiting  pain, tingling, numbness in the hands or feet  stomach pain This list may not describe all possible side effects. Call your doctor for medical advice about side effects. You may report side effects to FDA at 1-800-FDA-1088. Where should I keep my medicine? Keep out of the reach of children. Store at room temperature between 15 and 30 degrees C (59 and 86 degrees F). Keep container tightly closed. Throw away any unused medicine after the expiration date. NOTE: This sheet is a summary. It may not cover all possible information. If you have questions about this medicine, talk to your doctor, pharmacist, or health care provider.  2020 Elsevier/Gold Standard (2017-12-14 14:59:59)

## 2019-06-11 ENCOUNTER — Other Ambulatory Visit: Payer: Self-pay | Admitting: Cardiovascular Disease

## 2019-07-19 ENCOUNTER — Ambulatory Visit (INDEPENDENT_AMBULATORY_CARE_PROVIDER_SITE_OTHER): Payer: 59 | Admitting: Internal Medicine

## 2019-07-19 ENCOUNTER — Encounter: Payer: Self-pay | Admitting: Internal Medicine

## 2019-07-19 VITALS — BP 114/70 | HR 78 | Temp 97.6°F | Ht 63.5 in | Wt 149.0 lb

## 2019-07-19 DIAGNOSIS — K50919 Crohn's disease, unspecified, with unspecified complications: Secondary | ICD-10-CM

## 2019-07-19 DIAGNOSIS — Z2883 Immunization not carried out due to unavailability of vaccine: Secondary | ICD-10-CM

## 2019-07-19 MED ORDER — SULFASALAZINE 500 MG PO TABS: ORAL_TABLET | ORAL | 11 refills | Status: DC

## 2019-07-19 NOTE — Patient Instructions (Signed)
We have sent the following medications to your pharmacy for you to pick up at your convenience:  Sulfasalazine.  You will be due for a colonoscopy in one year

## 2019-07-20 ENCOUNTER — Encounter: Payer: Self-pay | Admitting: Internal Medicine

## 2019-07-20 NOTE — Progress Notes (Signed)
HISTORY OF PRESENT ILLNESS:  Brittany [name] is a 64 y.o. female with a history of aortic stenosis status post aortic valve replacement, and Crohn's colitis for which she is followed in this office.  She is maintained on sulfasalazine 2 g twice daily.  Her last surveillance colonoscopy was performed January 2020.  She was found to have mild active Crohn's colitis with patchy inflammation.  Biopsies throughout revealed moderately active chronic colitis without dysplasia.  She also had a sessile serrated polyp without dysplasia.  Routine follow-up in 2 years for colonoscopy recommended.  We did wish to have the patient come to the office after colonoscopy to discuss the findings consider change in therapy to address ongoing active colonic inflammation.  She did not secondary to the pandemic.  However she continues to feel well.  Infrequently she will report a dull right-sided pain that may last a few hours.  She is had no change in her bowel habits.  No bleeding.  Last blood work from her PCP was unremarkable.  She is due for comprehensive blood work next month at the time of her routine follow-up.  She did want to discuss the Covid vaccine  REVIEW OF SYSTEMS:  All non-GI ROS negative unless otherwise stated in the HPI except for headaches  Past Medical History:  Diagnosis Date  . Anemia, unspecified    pt doesn't feel this is a recurrent issue for her  . Aortic stenosis   . Basal cell carcinoma     left ear Sept 2015  . Colitis   . Crohn's disease ([place])   . Dyslipidemia   . Heart murmur   . Hypercholesterolemia   . Hypertension    Unspecified    Past Surgical History:  Procedure Laterality Date  . AORTIC VALVE REPLACEMENT  2008  . CYST REMOVAL HAND Right 2017   right index finger   . pylonidal cyst removed    . TUBAL LIGATION      Social History Brittany [name]  reports that she has never smoked. She has never used smokeless tobacco. She reports current alcohol use. She reports  that she does not use drugs.  family history includes Alzheimer's disease in her maternal grandmother and mother; Breast cancer in her paternal aunt; Crohn's disease in her sister; Diabetes in her paternal grandfather; Headache in her mother; Heart attack in her paternal grandfather; Heart disease in her paternal grandfather; Hypertension in her father; Pancreatic cancer in her maternal grandfather; Prostate cancer in her father.  No Known Allergies     PHYSICAL EXAMINATION: Vital signs: BP 114/70   Pulse 78   Temp 97.6 F (36.4 C)   Ht 5' 3.5" (1.613 m)   Wt 149 lb (67.6 kg)   BMI 25.98 kg/m   Constitutional: generally well-appearing, no acute distress Psychiatric: alert and oriented x3, cooperative Eyes: extraocular movements intact, anicteric, conjunctiva pink Mouth: oral pharynx moist, no lesions Neck: supple no lymphadenopathy Cardiovascular: heart regular rate and rhythm Lungs: clear to auscultation bilaterally Abdomen: soft, nontender, nondistended, no obvious ascites, no peritoneal signs, normal bowel sounds, no organomegaly Rectal: Omitted Extremities: no clubbing, cyanosis, or lower extremity edema bilaterally Skin: no lesions on visible extremities Neuro: No focal deficits.  Cranial nerves intact  ASSESSMENT:  1.  Crohn's colitis.  Patchy colonic inflammation on last colonoscopy.  She remains asymptomatic 2.  History of sessile serrated polyp 3.  Questions regarding Covid vaccine  PLAN:  1.  Continue sulfasalazine 2 g twice daily 2.  Refill  medication.  Medication risks reviewed 3.  Patient wishes to wait till her next colonoscopy to reassess the degree of inflammation before entertaining therapeutic changes 4.  Covid vaccine discussed.  She was encouraged to proceed.  Multiple questions answered 5.  Obtain blood work from Dr. Jacquiline [name] office next month A comprehensive total of 30 minutes was spent preparing to see the patient, reviewing test, obtaining history,  performing physical examination, counseling and educating the patient regarding the above listed issues, ordering medication and discussing its risks, and documenting the clinical information in the health record

## 2020-06-04 ENCOUNTER — Other Ambulatory Visit: Payer: Self-pay | Admitting: Cardiovascular Disease

## 2020-06-24 NOTE — Progress Notes (Incomplete)
Patient ID: Brittany [name], female   DOB: 08-14-1955, 65 y.o.   MRN: [id number]     66 y.o. female  seen today in followup for her aortic valve replacement. Had bioprosthetic valve at young age . Her surgery was done in 2008. Observes SBE prophylaxis No chest pain , dyspnea, syncope palpitations or edema  Reviewed last TTE done 04/2016 EF 60-65% no AR mean gradient 11 mmHg peak 19 mmHg DVI .58  Echo reviewed 05/01/19 peak velocity up to 2.8 with mean gradient 16 and peak 32 mmHG also mild MR and mild LVH   No complaints Seeing dentist twice/year   ***  ROS: Denies fever, malais, weight loss, blurry vision, decreased visual acuity, cough, sputum, SOB, hemoptysis, pleuritic pain, palpitaitons, heartburn, abdominal pain, melena, lower extremity edema, claudication, or rash.  All other systems reviewed and negative  General: There were no vitals taken for this visit. Affect appropriate Healthy:  appears stated age 89: normal Neck supple with no adenopathy JVP normal no bruits no thyromegaly Lungs clear with no wheezing and good diaphragmatic motion Heart:  S1/S2 SEM through AVR no AR  murmur, no rub, gallop or click PMI normal post sternotomy  Abdomen: benighn, BS positve, no tenderness, no AAA no bruit.  No HSM or HJR Distal pulses intact with no bruits No edema Neuro non-focal Skin warm and dry No muscular weakness   Current Outpatient Medications  Medication Sig Dispense Refill  . aspirin EC 81 MG tablet Take 81 mg by mouth daily.    . CRESTOR 40 MG tablet Take 40 mg by mouth daily.   10  . metoprolol tartrate (LOPRESSOR) 25 MG tablet Take 1 tablet (25 mg total) by mouth 2 (two) times daily. Please keep upcoming appointment in Jan 2022 for future refills. Thank you 60 tablet 1  . Multiple Vitamins-Minerals (CENTRUM) tablet Take 1 tablet by mouth daily.      . rizatriptan (MAXALT-MLT) 10 MG disintegrating tablet Take 1 tablet (10 mg total) by mouth as needed for migraine.  May repeat in 2 hours if needed 9 tablet 11  . sulfaSALAzine (AZULFIDINE) 500 MG tablet Take 4 tablets twice a day 240 tablet 11   No current facility-administered medications for this visit.    Allergies  Patient has no known allergies.  Electrocardiogram:  05/01/19 SR rate 66 LAE  Assessment and Plan  AVR: echo 05/01/19 with more elevated gradients but expected over 3 years No AR EF normal and asymptomatic f/u echo ordered  She has a 21 mm bovine tissue valve CE Model 3000 placed by [facility] July 7/ 2008   HLD: : on statin labs followed by [name]: stable no flares on azulfidine f/u Dr Henrene Pastor ? F/u colonoscopy due last 07/05/18   F/U with me in a year  Echo for AVR    [name]

## 2020-06-25 DIAGNOSIS — U071 COVID-19: Secondary | ICD-10-CM | POA: Diagnosis not present

## 2020-06-28 ENCOUNTER — Other Ambulatory Visit (HOSPITAL_COMMUNITY): Payer: Self-pay

## 2020-06-28 ENCOUNTER — Ambulatory Visit: Payer: 59 | Admitting: Cardiovascular Disease

## 2020-07-15 DIAGNOSIS — D2262 Melanocytic nevi of left upper limb, including shoulder: Secondary | ICD-10-CM | POA: Diagnosis not present

## 2020-07-15 DIAGNOSIS — Z85828 Personal history of other malignant neoplasm of skin: Secondary | ICD-10-CM | POA: Diagnosis not present

## 2020-07-15 DIAGNOSIS — D2261 Melanocytic nevi of right upper limb, including shoulder: Secondary | ICD-10-CM | POA: Diagnosis not present

## 2020-07-15 DIAGNOSIS — D1801 Hemangioma of skin and subcutaneous tissue: Secondary | ICD-10-CM | POA: Diagnosis not present

## 2020-07-16 ENCOUNTER — Telehealth: Payer: Self-pay

## 2020-07-16 DIAGNOSIS — Z952 Presence of prosthetic heart valve: Secondary | ICD-10-CM

## 2020-07-16 DIAGNOSIS — E785 Hyperlipidemia, unspecified: Secondary | ICD-10-CM

## 2020-07-16 NOTE — Telephone Encounter (Signed)
-----   Message from [facility] sent at 07/16/2020  9:17 AM EST ----- Regarding: Echo Order can you fix this echo order to where it doesn't expire until May. She wants to do her echo on the same day as her appt with Dr. Johnsie Cancel which is 10/01/20 but it expires in February.

## 2020-07-16 NOTE — Telephone Encounter (Signed)
Replaced order with current echo order, so patient can have echo on same day as office visit.

## 2020-08-03 ENCOUNTER — Other Ambulatory Visit: Payer: Self-pay | Admitting: Cardiovascular Disease

## 2020-08-03 ENCOUNTER — Other Ambulatory Visit: Payer: Self-pay | Admitting: Internal Medicine

## 2020-08-14 DIAGNOSIS — Z0000 Encounter for general adult medical examination without abnormal findings: Secondary | ICD-10-CM | POA: Diagnosis not present

## 2020-08-14 DIAGNOSIS — E785 Hyperlipidemia, unspecified: Secondary | ICD-10-CM | POA: Diagnosis not present

## 2020-08-21 ENCOUNTER — Encounter: Payer: Self-pay | Admitting: Internal Medicine

## 2020-08-21 ENCOUNTER — Ambulatory Visit (INDEPENDENT_AMBULATORY_CARE_PROVIDER_SITE_OTHER): Payer: BC Managed Care – PPO | Admitting: Internal Medicine

## 2020-08-21 VITALS — BP 112/70 | HR 67 | Ht 63.5 in | Wt 146.8 lb

## 2020-08-21 DIAGNOSIS — K219 Gastro-esophageal reflux disease without esophagitis: Secondary | ICD-10-CM

## 2020-08-21 DIAGNOSIS — K50919 Crohn's disease, unspecified, with unspecified complications: Secondary | ICD-10-CM

## 2020-08-21 DIAGNOSIS — R110 Nausea: Secondary | ICD-10-CM | POA: Diagnosis not present

## 2020-08-21 MED ORDER — SULFASALAZINE 500 MG PO TABS: ORAL_TABLET | ORAL | 3 refills | Status: DC

## 2020-08-21 MED ORDER — ONDANSETRON HCL 8 MG PO TABS: ORAL_TABLET | ORAL | 0 refills | Status: DC

## 2020-08-21 NOTE — Patient Instructions (Signed)
We have sent the following medications to your pharmacy for you to pick up at your convenience:  Sulfasalazine.  Zofran - take one tablet approximately 20 minutes before taking each prep.   Take Prilosex 6m over the counter daily.  You have been scheduled for a colonoscopy. Please follow written instructions given to you at your visit today.  Please pick up your prep supplies at the pharmacy within the next 1-3 days. If you use inhalers (even only as needed), please bring them with you on the day of your procedure.

## 2020-08-21 NOTE — Progress Notes (Signed)
HISTORY OF PRESENT ILLNESS:  Brittany [name] is a 65 y.o. female with a history of aortic stenosis status post aortic valve replacement, and chronic Crohn's colitis for which she is followed in this office.  She has been maintained on sulfasalazine 2 g twice daily.  Her last surveillance colonoscopy was performed January 2020.  She was found to have mild active Crohn's colitis with patchy inflammation.  Biopsies throughout revealed moderately active chronic colitis without dysplasia.  She also has a sessile serrated polyp without dysplasia.  Follow-up in 2 years recommended.  We did discuss pivoting way from sulfasalazine therapy to more conventional treatments.  She has been a bit apprehensive and was more in favor of having a follow-up examination to reassess the degree of inflammation.  She tells me that she has seen some rectal bleeding intermittently.  No abdominal pain.  She also mentions more frequent problems with reflux as manifested by indigestion and heartburn.  She does take over-the-counter agents such as Pepcid.  No dysphagia.  Some abdominal bloating.  She does have alternating bowel habits which she states are not problematic.  REVIEW OF SYSTEMS:  All non-GI ROS negative unless otherwise stated in the HPI except for cough, headaches, sore throat  Past Medical History:  Diagnosis Date  . Anemia, unspecified    pt doesn't feel this is a recurrent issue for her  . Aortic stenosis   . Basal cell carcinoma     left ear Sept 2015  . Colitis   . Crohn's disease ([place])   . Dyslipidemia   . Heart murmur   . Hypercholesterolemia   . Hypertension    Unspecified    Past Surgical History:  Procedure Laterality Date  . AORTIC VALVE REPLACEMENT  2008  . CYST REMOVAL HAND Right 2017   right index finger   . pylonidal cyst removed    . TUBAL LIGATION      Social History Brittany [name]  reports that she has never smoked. She has never used smokeless tobacco. She reports current  alcohol use of about 3.0 standard drinks of alcohol per week. She reports that she does not use drugs.  family history includes Alzheimer's disease in her maternal grandmother and mother; Breast cancer in her paternal aunt; Crohn's disease in her sister; Diabetes in her paternal grandfather; Headache in her mother; Heart attack in her paternal grandfather; Heart disease in her paternal grandfather; Hypertension in her father; Pancreatic cancer in her maternal grandfather; Prostate cancer in her father.  No Known Allergies     PHYSICAL EXAMINATION: Vital signs: BP 112/70   Pulse 67   Ht 5' 3.5" (1.613 m)   Wt 146 lb 12.8 oz (66.6 kg)   LMP 07/29/2011   SpO2 99%   BMI 25.60 kg/m   Constitutional: generally well-appearing, no acute distress Psychiatric: alert and oriented x3, cooperative Eyes: extraocular movements intact, anicteric, conjunctiva pink Mouth: oral pharynx moist, no lesions Neck: supple no lymphadenopathy Cardiovascular: heart regular rate and rhythm. Lungs: clear to auscultation bilaterally Abdomen: soft, nontender, nondistended, no obvious ascites, no peritoneal signs, normal bowel sounds, no organomegaly Rectal: Deferred until colonoscopy Extremities: no clubbing, cyanosis, or lower extremity edema bilaterally Skin: no lesions on visible extremities Neuro: No focal deficits.  Cranial nerves intact  ASSESSMENT:  1.  Longstanding Crohn's colitis.  Last colonoscopy 2020 with chronic inflammation.  Also sessile serrated polyp.  On chronic sulfasalazine therapy 2.  GERD.  More frequent symptoms recently.  No alarm features 3.  Problems with nausea and vomiting associated with with colon prep 4.  Prior history of aortic valve replacement   PLAN:  1.  Refill sulfasalazine 2 g twice daily 2.  Reflux precautions 3.  Prilosec OTC.  Can take daily.  Can take on demand.  Reviewed. 4.  Schedule surveillance colonoscopy with biopsies.The nature of the procedure, as well as  the risks, benefits, and alternatives were carefully and thoroughly reviewed with the patient. Ample time for discussion and questions allowed. The patient understood, was satisfied, and agreed to proceed. 5.  Prescribe Zofran 8 mg 20 to 30 minutes before each prep session to reduce the risk of nausea and vomiting.

## 2020-08-28 DIAGNOSIS — I10 Essential (primary) hypertension: Secondary | ICD-10-CM | POA: Diagnosis not present

## 2020-08-28 DIAGNOSIS — R82998 Other abnormal findings in urine: Secondary | ICD-10-CM | POA: Diagnosis not present

## 2020-08-28 DIAGNOSIS — Z1331 Encounter for screening for depression: Secondary | ICD-10-CM | POA: Diagnosis not present

## 2020-08-28 DIAGNOSIS — Z1339 Encounter for screening examination for other mental health and behavioral disorders: Secondary | ICD-10-CM | POA: Diagnosis not present

## 2020-08-28 DIAGNOSIS — Z0000 Encounter for general adult medical examination without abnormal findings: Secondary | ICD-10-CM | POA: Diagnosis not present

## 2020-09-18 ENCOUNTER — Telehealth: Payer: Self-pay | Admitting: Internal Medicine

## 2020-09-18 MED ORDER — SUTAB 1479-225-188 MG PO TABS: 1.0000 | ORAL_TABLET | Freq: Once | ORAL | 0 refills | Status: AC

## 2020-09-18 NOTE — Telephone Encounter (Signed)
Sent Sutab to pharmacy

## 2020-09-23 NOTE — Progress Notes (Signed)
Patient ID: Brittany [name], female   DOB: Jun 06, 1956, 65 y.o.   MRN: [id number]     65 y.o. female  seen today in followup for her aortic valve replacement. Had bioprosthetic valve at young age . Her surgery was done in 2008. Observes SBE prophylaxis No chest pain , dyspnea, syncope palpitations or edema  TTE done 04/2016 EF 60-65% no AR mean gradient 11 mmHg peak 19 mmHg DVI .58  TTE done  05/01/19 peak velocity up to 2.8 with mean gradient 16 and peak 32 mmHG also mild MR and mild LVH   TTE done 10/03/2020 mean gradient 16 peak 28 mmHg reviewed preliminary pictures   No complaints Seeing dentist twice/year  She sees Dr Brittany [name] for [place] and is maintained on  Azulfidine Last colonoscopy Over 2 years ago 07/05/18  Stress from tax season finally over Seeing [place] for BP and monitoring    ROS: Denies fever, malais, weight loss, blurry vision, decreased visual acuity, cough, sputum, SOB, hemoptysis, pleuritic pain, palpitaitons, heartburn, abdominal pain, melena, lower extremity edema, claudication, or rash.  All other systems reviewed and negative  General: BP 132/80   Pulse 69   Ht 5' 4"  (1.626 m)   Wt 67.1 kg   LMP 07/29/2011   SpO2 98%   BMI 25.40 kg/m  Affect appropriate Healthy:  appears stated age 72: normal Neck supple with no adenopathy JVP normal no bruits no thyromegaly Lungs clear with no wheezing and good diaphragmatic motion Heart:  S1/S2 SEM through AVR no AR  murmur, no rub, gallop or click PMI normal post sternotomy  Abdomen: benighn, BS positve, no tenderness, no AAA no bruit.  No HSM or HJR Distal pulses intact with no bruits No edema Neuro non-focal Skin warm and dry No muscular weakness   Current Outpatient Medications  Medication Sig Dispense Refill  . aspirin EC 81 MG tablet Take 81 mg by mouth daily.    . CRESTOR 40 MG tablet Take 40 mg by mouth daily.   10  . metoprolol tartrate (LOPRESSOR) 25 MG tablet TAKE 1 TABLET BY MOUTH TWICE  DAILY 60 tablet 11  . Multiple Vitamins-Minerals (CENTRUM) tablet Take 1 tablet by mouth daily.    . ondansetron (ZOFRAN) 8 MG tablet Take 1 tablet 20 minutes before taking each half of the prep 2 tablet 0  . sulfaSALAzine (AZULFIDINE) 500 MG tablet TAKE 4 TABLETS BY MOUTH TWICE DAILY 240 tablet 3   No current facility-administered medications for this visit.    Allergies  Patient has no known allergies.  Electrocardiogram:  05/01/19 SR rate 66 LAE  Assessment and Plan  AVR: She has a 21 mm bovine tissue valve CE Model 3000 placed by [facility] July 7/ 2008  TTE stable gradients today No need for SBE with colonoscopy  HLD: : on statin labs followed by Brittany [name]: stable no flares on azulfidine F/U Dr Brittany [name] colonoscopy tomorrow HTN:  Continue Lopressor Borderline elevated during stress of tax season f/u Brittany [name] When tax season is over her home readings will be fine   F/U with me in a year    Brittany [name]

## 2020-10-01 ENCOUNTER — Other Ambulatory Visit (HOSPITAL_COMMUNITY): Payer: Self-pay

## 2020-10-01 ENCOUNTER — Ambulatory Visit: Payer: Self-pay | Admitting: Cardiovascular Disease

## 2020-10-03 ENCOUNTER — Ambulatory Visit (INDEPENDENT_AMBULATORY_CARE_PROVIDER_SITE_OTHER): Payer: BC Managed Care – PPO | Admitting: Cardiovascular Disease

## 2020-10-03 ENCOUNTER — Other Ambulatory Visit: Payer: Self-pay

## 2020-10-03 ENCOUNTER — Encounter: Payer: Self-pay | Admitting: Cardiovascular Disease

## 2020-10-03 ENCOUNTER — Ambulatory Visit (HOSPITAL_COMMUNITY): Payer: BC Managed Care – PPO | Attending: Cardiology

## 2020-10-03 VITALS — BP 132/80 | HR 69 | Ht 64.0 in | Wt 148.0 lb

## 2020-10-03 DIAGNOSIS — I10 Essential (primary) hypertension: Secondary | ICD-10-CM

## 2020-10-03 DIAGNOSIS — Z952 Presence of prosthetic heart valve: Secondary | ICD-10-CM

## 2020-10-03 DIAGNOSIS — E785 Hyperlipidemia, unspecified: Secondary | ICD-10-CM

## 2020-10-03 LAB — ECHOCARDIOGRAM COMPLETE
AR max vel: 0.79 cm2
AV Area VTI: 0.85 cm2
AV Area mean vel: 0.75 cm2
AV Mean grad: 16 mmHg
AV Peak grad: 27.7 mmHg
Ao pk vel: 2.63 m/s
Area-P 1/2: 3.64 cm2
S' Lateral: 2.1 cm

## 2020-10-03 NOTE — Patient Instructions (Signed)
Medication Instructions:  No changes *If you need a refill on your cardiac medications before your next appointment, please call your pharmacy*   Lab Work: NONE If you have labs (blood work) drawn today and your tests are completely normal, you will receive your results only by: Marland Kitchen MyChart Message (if you have MyChart) OR . A paper copy in the mail If you have any lab test that is abnormal or we need to change your treatment, we will call you to review the results.   Testing/Procedures: NONE   Follow-Up: At [facility], you and your health needs are our priority.  As part of our continuing mission to provide you with exceptional heart care, we have created designated Provider Care Teams.  These Care Teams include your primary Cardiologist (physician) and Advanced Practice Providers (APPs -  Physician Assistants and Nurse Practitioners) who all work together to provide you with the care you need, when you need it.  We recommend signing up for the patient portal called "MyChart".  Sign up information is provided on this After Visit Summary.  MyChart is used to connect with patients for Virtual Visits (Telemedicine).  Patients are able to view lab/test results, encounter notes, upcoming appointments, etc.  Non-urgent messages can be sent to your provider as well.   To learn more about what you can do with MyChart, go to [email or web address].    Your next appointment:   12 month(s)  The format for your next appointment:   In Person  Provider:   [name], MD   Other Instructions NONE

## 2020-10-04 ENCOUNTER — Other Ambulatory Visit: Payer: Self-pay

## 2020-10-04 ENCOUNTER — Encounter: Payer: Self-pay | Admitting: Internal Medicine

## 2020-10-04 ENCOUNTER — Other Ambulatory Visit (INDEPENDENT_AMBULATORY_CARE_PROVIDER_SITE_OTHER): Payer: BC Managed Care – PPO

## 2020-10-04 ENCOUNTER — Ambulatory Visit (AMBULATORY_SURGERY_CENTER): Payer: BC Managed Care – PPO | Admitting: Internal Medicine

## 2020-10-04 VITALS — BP 123/75 | HR 74 | Temp 98.0°F | Resp 16 | Ht 63.0 in | Wt 146.0 lb

## 2020-10-04 DIAGNOSIS — K50919 Crohn's disease, unspecified, with unspecified complications: Secondary | ICD-10-CM

## 2020-10-04 DIAGNOSIS — D125 Benign neoplasm of sigmoid colon: Secondary | ICD-10-CM

## 2020-10-04 DIAGNOSIS — Z86010 Personal history of colonic polyps: Secondary | ICD-10-CM

## 2020-10-04 DIAGNOSIS — K635 Polyp of colon: Secondary | ICD-10-CM | POA: Diagnosis not present

## 2020-10-04 DIAGNOSIS — Z8719 Personal history of other diseases of the digestive system: Secondary | ICD-10-CM | POA: Diagnosis not present

## 2020-10-04 DIAGNOSIS — K5150 Left sided colitis without complications: Secondary | ICD-10-CM

## 2020-10-04 DIAGNOSIS — K529 Noninfective gastroenteritis and colitis, unspecified: Secondary | ICD-10-CM

## 2020-10-04 LAB — CBC WITH DIFFERENTIAL/PLATELET
Basophils Absolute: 0.1 10*3/uL (ref 0.0–0.1)
Basophils Relative: 1.1 % (ref 0.0–3.0)
Eosinophils Absolute: 0.1 10*3/uL (ref 0.0–0.7)
Eosinophils Relative: 1 % (ref 0.0–5.0)
HCT: 35.9 % — ABNORMAL LOW (ref 36.0–46.0)
Hemoglobin: 12.2 g/dL (ref 12.0–15.0)
Lymphocytes Relative: 21.7 % (ref 12.0–46.0)
Lymphs Abs: 1.7 10*3/uL (ref 0.7–4.0)
MCHC: 33.9 g/dL (ref 30.0–36.0)
MCV: 99.5 fl (ref 78.0–100.0)
Monocytes Absolute: 0.8 10*3/uL (ref 0.1–1.0)
Monocytes Relative: 9.6 % (ref 3.0–12.0)
Neutro Abs: 5.3 10*3/uL (ref 1.4–7.7)
Neutrophils Relative %: 66.6 % (ref 43.0–77.0)
Platelets: 191 10*3/uL (ref 150.0–400.0)
RBC: 3.61 Mil/uL — ABNORMAL LOW (ref 3.87–5.11)
RDW: 14.1 % (ref 11.5–15.5)
WBC: 7.9 10*3/uL (ref 4.0–10.5)

## 2020-10-04 LAB — C-REACTIVE PROTEIN: CRP: <1 mg/dL (ref 0.5–20.0)

## 2020-10-04 LAB — COMPREHENSIVE METABOLIC PANEL
ALT: 22 U/L (ref 0–35)
AST: 23 U/L (ref 0–37)
Albumin: 4 g/dL (ref 3.5–5.2)
Alkaline Phosphatase: 53 U/L (ref 39–117)
BUN: 9 mg/dL (ref 6–23)
CO2: 26 mEq/L (ref 19–32)
Calcium: 8.8 mg/dL (ref 8.4–10.5)
Chloride: 106 mEq/L (ref 96–112)
Creatinine, Ser: 0.8 mg/dL (ref 0.40–1.20)
GFR: 78.09 mL/min (ref >60.00)
Glucose, Bld: 89 mg/dL (ref 70–99)
Potassium: 4.4 mEq/L (ref 3.5–5.1)
Sodium: 140 mEq/L (ref 135–145)
Total Bilirubin: 0.6 mg/dL (ref 0.2–1.2)
Total Protein: 6.6 g/dL (ref 6.0–8.3)

## 2020-10-04 LAB — SEDIMENTATION RATE: Sed Rate: 7 mm/hr (ref 0–30)

## 2020-10-04 MED ORDER — SODIUM CHLORIDE 0.9 % IV SOLN: 500.0000 mL | Freq: Once | INTRAVENOUS | Status: DC

## 2020-10-04 NOTE — Progress Notes (Signed)
Called to room to assist during endoscopic procedure.  Patient ID and intended procedure confirmed with present staff. Received instructions for my participation in the procedure from the performing physician.  

## 2020-10-04 NOTE — Progress Notes (Signed)
pt tolerated well. VSS. awake and to recovery. Report given to RN.  

## 2020-10-04 NOTE — Progress Notes (Signed)
Nurse from 4th floor called and indicated that Dr. Henrene Pastor would like labs entered for patient: CBC, CMET, ESR and CRP. Labs entered  (Crohn's disease)

## 2020-10-04 NOTE — Op Note (Signed)
[facility] Patient Name: Brittany [name] Procedure Date: 10/04/2020 9:25 AM MRN: [id number] Endoscopist: Docia Chuck. Henrene Pastor , MD Age: 64 Referring MD:  Date of Birth: Apr 02, 1956 Gender: Female Account #: [id number] Procedure:                Colonoscopy with biopsy; with cold snare                            polypectomy x 1 Indications:              High risk colon cancer surveillance: Crohn's                            colitis of 8 (or more) years duration with                            one-third (or more) of the colon involved. Also a                            history of SSP. On Azulfidine 2 g twice daily. Last                            colonoscopy January 2020 with moderate to severe                            active colitis Medicines:                Monitored Anesthesia Care Procedure:                Pre-Anesthesia Assessment:                           - Prior to the procedure, a History and Physical                            was performed, and patient medications and                            allergies were reviewed. The patient's tolerance of                            previous anesthesia was also reviewed. The risks                            and benefits of the procedure and the sedation                            options and risks were discussed with the patient.                            All questions were answered, and informed consent                            was obtained. Prior Anticoagulants: The patient has  taken no previous anticoagulant or antiplatelet                            agents. After reviewing the risks and benefits, the                            patient was deemed in satisfactory condition to                            undergo the procedure.                           After obtaining informed consent, the colonoscope                            was passed under direct vision. Throughout the                             procedure, the patient's blood pressure, pulse, and                            oxygen saturations were monitored continuously. The                            Olympus CF-HQ190L ([id number]) Colonoscope was                            introduced through the anus and advanced to the the                            cecum, identified by appendiceal orifice and                            ileocecal valve. The ileocecal valve, appendiceal                            orifice, and rectum were photographed. The quality                            of the bowel preparation was excellent. The                            colonoscopy was performed without difficulty. The                            patient tolerated the procedure well. The bowel                            preparation used was SUPREP via split dose                            instruction. Scope In: 9:34:36 AM Scope Out: 9:54:10 AM Scope Withdrawal Time: 0 hours 14 minutes 0 seconds  Total Procedure Duration: 0 hours 19 minutes 34 seconds  Findings:  A 3 mm polyp was found in the sigmoid colon. The                            polyp was removed with a cold snare. Resection and                            retrieval were complete. A rare sigmoid                            diverticulum noted                           The colon revealed patchy colitis throughout. The                            cecum and most proximal ascending colon was spared.                            There was patchy colitis to varying degrees                            throughout the remainder of the colon. Multiple                            biopsies were taken in multiple jars.. Complications:            No immediate complications. Estimated blood loss:                            None. Estimated Blood Loss:     Estimated blood loss: none. Impression:               - One 3 mm polyp in the sigmoid colon, removed with                            a cold snare. Resected  and retrieved.                           - Rare sigmoid diverticulum                           - Patchy colitis throughout. Recommendation:           - Repeat colonoscopy in 2 years for surveillance.                           - Patient has a contact number available for                            emergencies. The signs and symptoms of potential                            delayed complications were discussed with the  patient. Return to normal activities tomorrow.                            Written discharge instructions were provided to the                            patient.                           - Resume previous diet.                           - Continue present medications.                           - Await pathology results.                           - Blood work today including C-reactive protein,                            ESR, CBC, and c-Met                           - Please make office follow-up with Dr. Henrene Pastor at                            your convenience to discuss endoscopic and                            pathologic findings Docia Chuck. Henrene Pastor, MD 10/04/2020 10:11:00 AM This report has been signed electronically.

## 2020-10-04 NOTE — Progress Notes (Signed)
VS by CW  Pt's states no medical or surgical changes since previsit or office visit.  

## 2020-10-04 NOTE — Patient Instructions (Signed)
Thank you for allowing [place] to care for you today!  Repeat surveillance colonoscopy in 2 years.  Resume previous diet and medications today.  Await pathology results, approximately 2 weeks.  You will have blood work drawn today. ( C-reactive protein, ESR, CBC, and c-Met)  Please make office visit with Dr Henrene Pastor to discuss findings and results of blood work.  Return to your normal daily activities tomorrow, 10/05/2020.       YOU HAD AN ENDOSCOPIC PROCEDURE TODAY AT [facility]:   Refer to the procedure report that was given to you for any specific questions about what was found during the examination.  If the procedure report does not answer your questions, please call your gastroenterologist to clarify.  If you requested that your care partner not be given the details of your procedure findings, then the procedure report has been included in a sealed envelope for you to review at your convenience later.  YOU SHOULD EXPECT: Some feelings of bloating in the abdomen. Passage of more gas than usual.  Walking can help get rid of the air that was put into your GI tract during the procedure and reduce the bloating. If you had a lower endoscopy (such as a colonoscopy or flexible sigmoidoscopy) you may notice spotting of blood in your stool or on the toilet paper. If you underwent a bowel prep for your procedure, you may not have a normal bowel movement for a few days.  Please Note:  You might notice some irritation and congestion in your nose or some drainage.  This is from the oxygen used during your procedure.  There is no need for concern and it should clear up in a day or so.  SYMPTOMS TO REPORT IMMEDIATELY:   Following lower endoscopy (colonoscopy or flexible sigmoidoscopy):  Excessive amounts of blood in the stool  Significant tenderness or worsening of abdominal pains  Swelling of the abdomen that is new, acute  Fever of 100F or higher      For urgent or emergent  issues, a gastroenterologist can be reached at any hour by calling [phone]. Do not use MyChart messaging for urgent concerns.    DIET:  We do recommend a small meal at first, but then you may proceed to your regular diet.  Drink plenty of fluids but you should avoid alcoholic beverages for 24 hours.  ACTIVITY:  You should plan to take it easy for the rest of today and you should NOT DRIVE or use heavy machinery until tomorrow (because of the sedation medicines used during the test).    FOLLOW UP: Our staff will call the number listed on your records 48-72 hours following your procedure to check on you and address any questions or concerns that you may have regarding the information given to you following your procedure. If we do not reach you, we will leave a message.  We will attempt to reach you two times.  During this call, we will ask if you have developed any symptoms of COVID 19. If you develop any symptoms (ie: fever, flu-like symptoms, shortness of breath, cough etc.) before then, please call [phone].  If you test positive for Covid 19 in the 2 weeks post procedure, please call and report this information to [place].    If any biopsies were taken you will be contacted by phone or by letter within the next 1-3 weeks.  Please call us at [phone] if you have not heard about the biopsies in  3 weeks.    SIGNATURES/CONFIDENTIALITY: You and/or your care partner have signed paperwork which will be entered into your electronic medical record.  These signatures attest to the fact that that the information above on your After Visit Summary has been reviewed and is understood.  Full responsibility of the confidentiality of this discharge information lies with you and/or your care-partner.

## 2020-10-08 ENCOUNTER — Telehealth: Payer: Self-pay | Admitting: *Deleted

## 2020-10-08 ENCOUNTER — Telehealth: Payer: Self-pay

## 2020-10-08 NOTE — Telephone Encounter (Signed)
  Follow up Call-  Call back number 10/04/2020 07/05/2018  Post procedure Call Back phone  # [phone]  Permission to leave phone message Yes Yes  Some recent data might be hidden     Patient questions:  Do you have a fever, pain , or abdominal swelling? No. Pain Score  0 *  Have you tolerated food without any problems? Yes.    Have you been able to return to your normal activities? Yes.    Do you have any questions about your discharge instructions: Diet   No. Medications  No. Follow up visit  No.  Do you have questions or concerns about your Care? No.  Actions: * If pain score is 4 or above: No action needed, pain <4. 1. Have you developed a fever since your procedure? no  2.   Have you had an respiratory symptoms (SOB or cough) since your procedure? no  3.   Have you tested positive for COVID 19 since your procedure no  4.   Have you had any family members/close contacts diagnosed with the COVID 19 since your procedure?  no   If yes to any of these questions please route to Joylene John, RN and Joella Prince, RN

## 2020-10-08 NOTE — Telephone Encounter (Signed)
Message left

## 2020-10-12 ENCOUNTER — Encounter: Payer: Self-pay | Admitting: Internal Medicine

## 2020-10-18 ENCOUNTER — Ambulatory Visit (INDEPENDENT_AMBULATORY_CARE_PROVIDER_SITE_OTHER): Payer: BC Managed Care – PPO | Admitting: Internal Medicine

## 2020-10-18 ENCOUNTER — Encounter: Payer: Self-pay | Admitting: Internal Medicine

## 2020-10-18 ENCOUNTER — Other Ambulatory Visit: Payer: Self-pay

## 2020-10-18 VITALS — BP 126/80 | HR 72 | Ht 63.5 in | Wt 147.0 lb

## 2020-10-18 DIAGNOSIS — K50919 Crohn's disease, unspecified, with unspecified complications: Secondary | ICD-10-CM

## 2020-10-18 MED ORDER — MESALAMINE 1.2 G PO TBEC: 2.4000 g | DELAYED_RELEASE_TABLET | Freq: Two times a day (BID) | ORAL | 3 refills | Status: DC

## 2020-10-18 NOTE — Patient Instructions (Signed)
We have sent the following medications to your pharmacy for you to pick up at your convenience:  Lialda

## 2020-10-18 NOTE — Progress Notes (Signed)
HISTORY OF PRESENT ILLNESS:  Brittany [name] is a 65 y.o. female with a history of aortic stenosis status post aortic valve replacement, and chronic Crohn's colitis for which she has been followed in this office for many years.  She has been maintained on sulfasalazine 2 g twice daily for quite some time.  She recently underwent surveillance colonoscopy on October 04, 2020.  She was found to have mild to moderately active chronic colitis as described.  Hyperplastic polyp.  She had active inflammation on her colonoscopy 2 years previous.  Blood work from October 04, 2020 was quite unremarkable.  Normal comprehensive metabolic panel, CBC, CRP, and ESR.  She really has no symptoms.  Occasional constipation.  She presents today to discuss ongoing active inflammation on endoscopy despite sulfasalazine therapy.  REVIEW OF SYSTEMS:  All non-GI ROS negative.  Past Medical History:  Diagnosis Date  . Anemia, unspecified    pt doesn't feel this is a recurrent issue for her  . Aortic stenosis   . Basal cell carcinoma     left ear Sept 2015  . Colitis   . Crohn's disease ([place])   . Dyslipidemia   . Heart murmur   . Hypercholesterolemia   . Hypertension    Unspecified    Past Surgical History:  Procedure Laterality Date  . AORTIC VALVE REPLACEMENT  2008  . CYST REMOVAL HAND Right 2017   right index finger   . pylonidal cyst removed    . TUBAL LIGATION      Social History Brittany [name]  reports that she has never smoked. She has never used smokeless tobacco. She reports current alcohol use of about 3.0 standard drinks of alcohol per week. She reports that she does not use drugs.  family history includes Alzheimer's disease in her maternal grandmother and mother; Breast cancer in her paternal aunt; Crohn's disease in her sister; Diabetes in her paternal grandfather; Headache in her mother; Heart attack in her paternal grandfather; Heart disease in her paternal grandfather; Hypertension in her  father; Pancreatic cancer in her maternal grandfather; Prostate cancer in her father.  No Known Allergies     PHYSICAL EXAMINATION: Vital signs: BP 126/80   Pulse 72   Ht 5' 3.5" (1.613 m)   Wt 147 lb (66.7 kg)   LMP 07/29/2011   BMI 25.63 kg/m   Constitutional: generally well-appearing, no acute distress Psychiatric: alert and oriented x3, cooperative Eyes: Anicteric mouth: oral pharynx moist, no lesions Abdomen: Not reexamined Skin: Normal hue Neuro: Grossly intact  ASSESSMENT:  1.  Chronic Crohn's colitis.  Active inflammation persistent despite sulfasalazine 2 g twice daily 2.  History of sessile serrated polyp   PLAN:  1.  We discussed the implications of chronic unchecked inflammation and potential complications including a higher risk for colon cancer.  We also discussed that there are a myriad of treatment options beyond sulfasalazine including mesalamine, immunomodulators, and biologic agents 2.  Stop sulfasalazine 3.  Prescribed Lialda 4.8 g daily.  Medication effects and risks reviewed. 4.  Routine office follow-up 1 year with laboratories.  She does contact the office in the interim for any questions or problems 5.  Plan surveillance colonoscopy in 2 years.

## 2020-11-04 ENCOUNTER — Telehealth: Payer: Self-pay | Admitting: Internal Medicine

## 2020-11-04 NOTE — Telephone Encounter (Signed)
Brittany [name] pt with Crohn's calling with problems. She has switched from sulfasalazine to Lialda after OV on 5/6.  Pt reports she is having diarrhea with a lot of gas build up and pains. She also reports passing BRB in the stool. Dr. Fuller Plan as DOD please advise.

## 2020-11-04 NOTE — Telephone Encounter (Signed)
Spoke with pt and she is aware. Scheduled her to see Dr. Henrene Pastor 11/19/20 at 3:40pm. Pt aware.

## 2020-11-04 NOTE — Telephone Encounter (Signed)
Resume sulfasalazine at prior dose and discontinue Lialda. Imodium tid prn diarrhea. Gas-X tid prn gas. Schedule REV with Dr. Henrene Pastor for further mgmt and follow up.

## 2020-11-04 NOTE — Telephone Encounter (Signed)
Inbound call from patient. States she just begin mesalamine and is having some diarrhea, stomach pains, blood in stool. Best contact number [phone]

## 2020-11-19 ENCOUNTER — Ambulatory Visit (INDEPENDENT_AMBULATORY_CARE_PROVIDER_SITE_OTHER): Payer: BC Managed Care – PPO | Admitting: Internal Medicine

## 2020-11-19 ENCOUNTER — Encounter: Payer: Self-pay | Admitting: Internal Medicine

## 2020-11-19 VITALS — BP 138/70 | HR 100 | Ht 64.0 in | Wt 145.0 lb

## 2020-11-19 DIAGNOSIS — K50919 Crohn's disease, unspecified, with unspecified complications: Secondary | ICD-10-CM

## 2020-11-19 MED ORDER — PREDNISONE 20 MG PO TABS: 20.0000 mg | ORAL_TABLET | Freq: Every day | ORAL | 1 refills | Status: DC

## 2020-11-19 NOTE — Progress Notes (Signed)
HISTORY OF PRESENT ILLNESS:  Brittany [name] is a 65 y.o. female with a history of aortic stenosis status post aortic valve replacement and chronic Crohn's colitis for which she has been followed in this office for many years.  Last colonoscopy 22nd 2022 with patchy colitis throughout.  Evaluated in the office Oct 18, 2020.  Felt to have active inflammation despite sulfasalazine 2 g twice daily.  We changed to Lialda 4.8 g daily.  About a week later describes abdominal cramping with loose stools urgency and bleeding.  She contacted the office and was told to stop Lialda and resume sulfasalazine.  Her problems continue, may be slightly better.  REVIEW OF SYSTEMS:  All non-GI ROS negative unless otherwise stated in the HPI.  Past Medical History:  Diagnosis Date  . Anemia, unspecified    pt doesn't feel this is a recurrent issue for her  . Aortic stenosis   . Basal cell carcinoma     left ear Sept 2015  . Colitis   . Crohn's disease ([place])   . Dyslipidemia   . Heart murmur   . Hypercholesterolemia   . Hypertension    Unspecified    Past Surgical History:  Procedure Laterality Date  . AORTIC VALVE REPLACEMENT  2008  . CYST REMOVAL HAND Right 2017   right index finger   . pylonidal cyst removed    . TUBAL LIGATION      Social History LEXXI KOSLOW  reports that she has never smoked. She has never used smokeless tobacco. She reports current alcohol use of about 3.0 standard drinks of alcohol per week. She reports that she does not use drugs.  family history includes Alzheimer's disease in her maternal grandmother and mother; Breast cancer in her paternal aunt; Crohn's disease in her sister; Diabetes in her paternal grandfather; Headache in her mother; Heart attack in her paternal grandfather; Heart disease in her paternal grandfather; Hypertension in her father; Pancreatic cancer in her maternal grandfather; Prostate cancer in her father.  No Known Allergies     PHYSICAL  EXAMINATION: Vital signs: BP 138/70   Pulse 100   Ht 5' 4"  (1.626 m)   Wt 145 lb (65.8 kg)   LMP 07/29/2011   BMI 24.89 kg/m   Constitutional: generally well-appearing, no acute distress Psychiatric: alert and oriented x3, cooperative Eyes: extraocular movements intact, anicteric, conjunctiva pink Mouth: Mask Neck: supple no lymphadenopathy Cardiovascular: heart regular rate and rhythm, no murmur Lungs: clear to auscultation bilaterally Abdomen: soft, nontender, nondistended, no obvious ascites, no peritoneal signs, normal bowel sounds, no organomegaly Rectal: Omitted Extremities: no clubbing, cyanosis, or lower extremity edema bilaterally Skin: no lesions on visible extremities Neuro: No focal deficits.  Cranial nerves intact  ASSESSMENT:  1.  Chronic Crohn's colitis.  Active disease on recent colonoscopy.  Change from sulfasalazine to Lialda.  Now with what sounds like a fairly typical colitis flare 2.  Aortic stenosis status post aortic valve replacement   PLAN:  1.  Continue sulfasalazine 2 g twice daily 2.  Start prednisone 20 mg daily.  Medication risks reviewed 3.  Asked patient contact the office in 1 week with follow-up.  We will adjust her steroid based on how she is doing 4.  May need to consider other strategies.  Would consider resumption of Lialda if this flare under control. 5.  Clinical follow-up to be determined

## 2020-11-19 NOTE — Patient Instructions (Signed)
If you are age 64 or older, your body mass index should be between 23-30. Your Body mass index is 24.89 kg/m. If this is out of the aforementioned range listed, please consider follow up with your Primary Care Provider.  If you are age 38 or younger, your body mass index should be between 19-25. Your Body mass index is 24.89 kg/m. If this is out of the aformentioned range listed, please consider follow up with your Primary Care Provider.   __________________________________________________________  The [facility] providers would like to encourage you to use [facility] to communicate with providers for non-urgent requests or questions.  Due to long hold times on the telephone, sending your provider a message by [facility] may be a faster and more efficient way to get a response.  Please allow 48 business hours for a response.  Please remember that this is for non-urgent requests.   We have sent the following medications to your pharmacy for you to pick up at your convenience:  Prednisone

## 2020-12-06 ENCOUNTER — Ambulatory Visit: Payer: BC Managed Care – PPO | Admitting: Internal Medicine

## 2021-02-10 IMAGING — MR MR HEAD WO/W CM
12 series · 48 of 48 positions shown · IV contrast (multihance)
Comparison: No pertinent prior studies available for comparison.

CLINICAL DATA: Non intractable headache, unspecified chronicity
pattern, unspecified headache type. Cluster headache, not
intractable, unspecified chronicity pattern. Additional history
provided: Increasing headaches over past year.

Creatinine was obtained on site at [HOSPITAL] at [HOSPITAL].
Results: Creatinine 0.8 mg/dL (GFR 79).
EXAM:
MRI HEAD WITHOUT AND WITH CONTRAST
TECHNIQUE: Multiplanar, multiecho pulse sequences of the brain and surrounding
structures were obtained without and with intravenous contrast.
CONTRAST:  13mL MULTIHANCE GADOBENATE DIMEGLUMINE 529 MG/ML IV SOLN

[Series 2: t1_se_sag · sagittal · 5.0mm · 0.45mm/px · 2 of 21 slices shown]
[im 1/21]
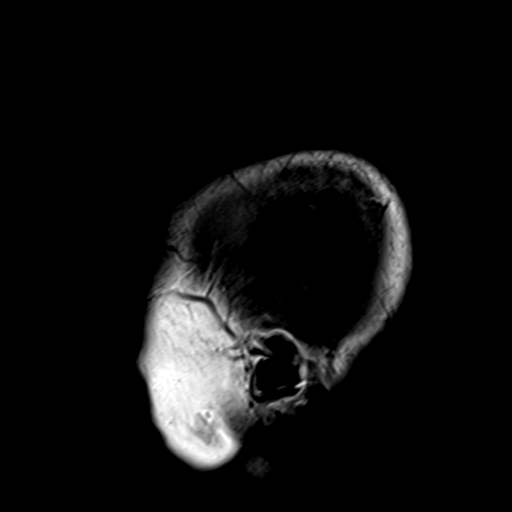
[im 21/21]
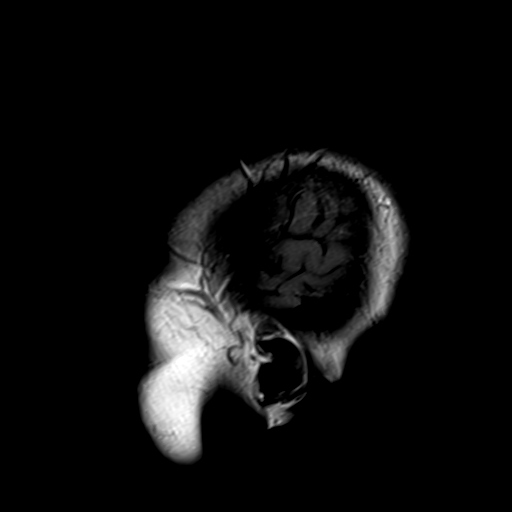

[Series 3: ep2d_diff_3 · axial · 3.0mm · 1.80mm/px · z∈[-4,+137]mm · 7 of 95 slices shown]
[im 1/95]
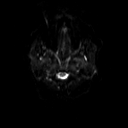
[im 16/95]
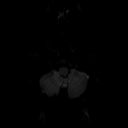
[im 32/95]
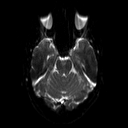
[im 48/95]
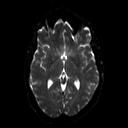
[im 63/95]
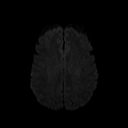
[im 79/95]
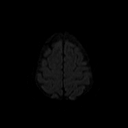
[im 95/95]
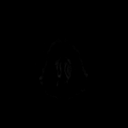

[Series 4: ep2d_diff_3_adc · axial · 3.0mm · 1.80mm/px · z∈[-4,+137]mm · 3 of 46 slices shown]
[im 1/46]
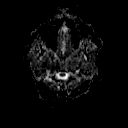
[im 23/46]
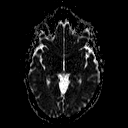
[im 46/46]
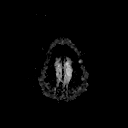

[Series 5: ep2d_diff_cor · coronal · 5.0mm · 1.77mm/px · 3 of 48 slices shown]
[im 1/48]
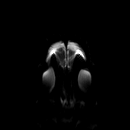
[im 24/48]
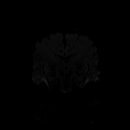
[im 48/48]
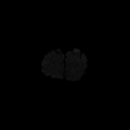

[Series 6: ep2d_diff_cor_adc · coronal · 5.0mm · 1.77mm/px · 2 of 24 slices shown]
[im 1/24]
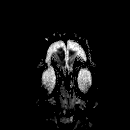
[im 24/24]
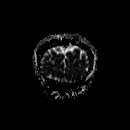

[Series 7: t2_tse_tra · axial · 5.0mm · 0.72mm/px · z∈[-3,+147]mm · 2 of 26 slices shown]
[im 1/26]
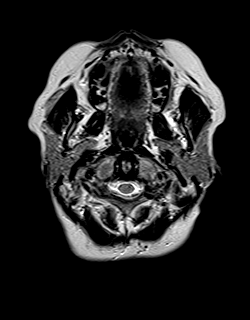
[im 26/26]
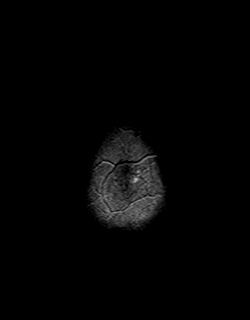

[Series 9: swi_images · axial · 4.0mm · 0.90mm/px · z∈[+2,+142]mm · 3 of 36 slices shown]
[im 1/36]
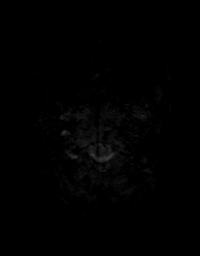
[im 18/36]
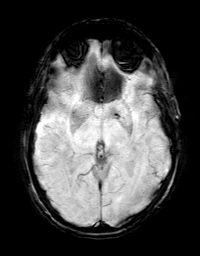
[im 36/36]
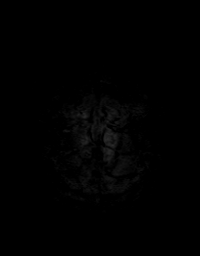

[Series 10: t1_mpr_tra · axial · 1.0mm · 0.72mm/px · z∈[+0,+143]mm · 10 of 144 slices shown]
[im 1/144]
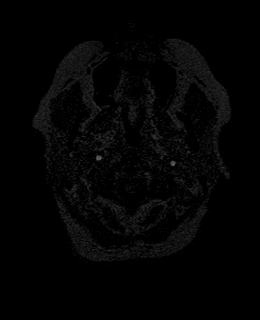
[im 16/144]
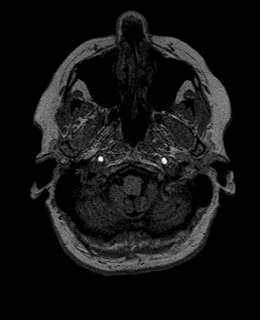
[im 32/144]
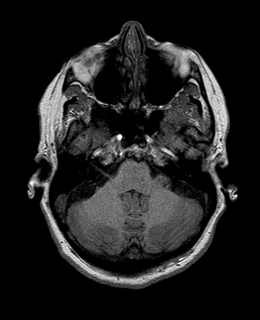
[im 48/144]
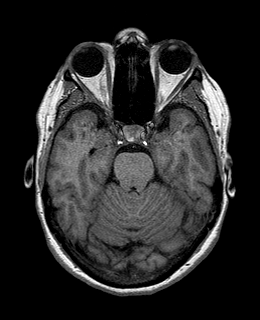
[im 64/144]
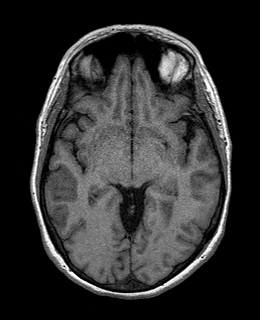
[im 80/144]
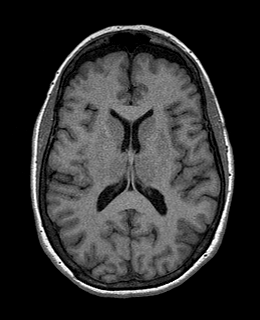
[im 96/144]
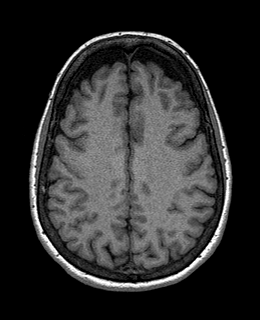
[im 112/144]
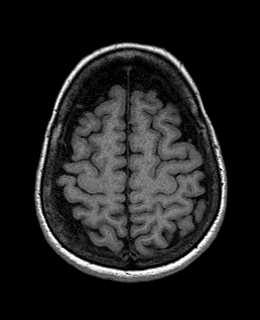
[im 128/144]
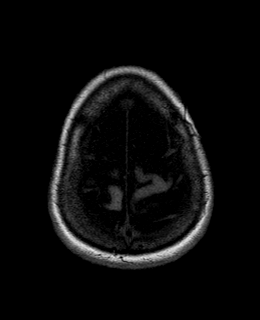
[im 144/144]
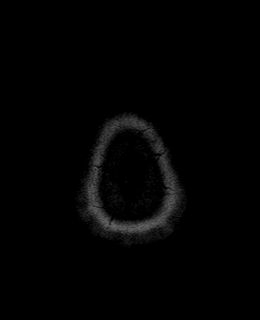

[Series 11: FLAIR · axial · 3.0mm · 0.43mm/px · z∈[-6,+149]mm · 2 of 27 slices shown]
[im 1/27]
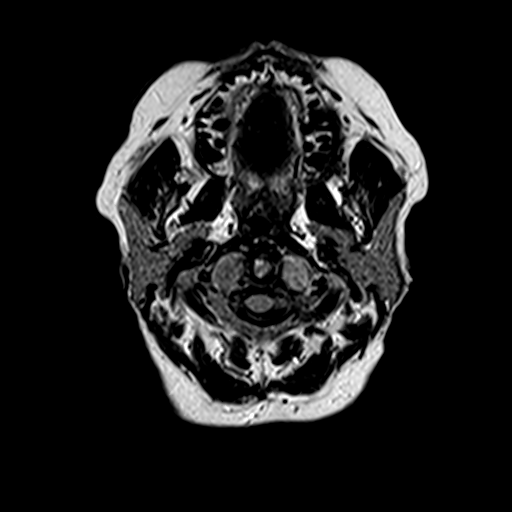
[im 27/27]
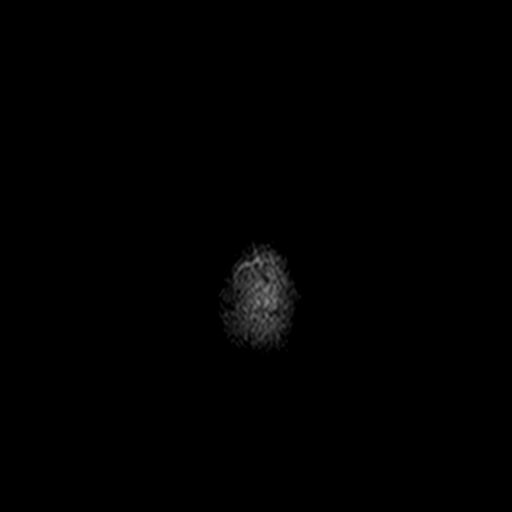

[Series 12: T2 · coronal · 5.0mm · 0.45mm/px · 2 of 26 slices shown]
[im 1/26]
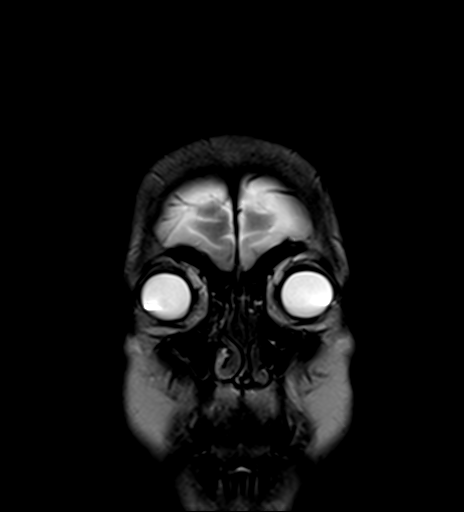
[im 26/26]
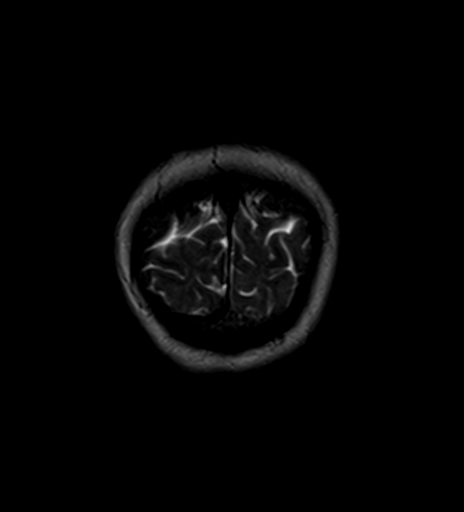

[Series 13: post t1_mpr_tra · axial · 1.0mm · 0.72mm/px · z∈[+0,+143]mm · 10 of 144 slices shown]
[im 1/144]
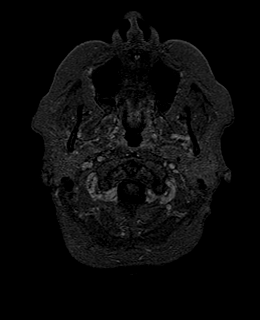
[im 16/144]
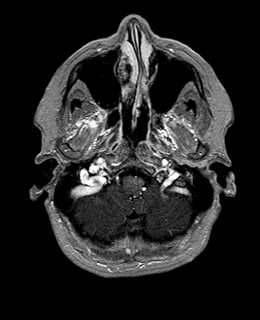
[im 32/144]
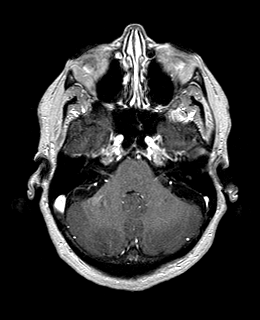
[im 48/144]
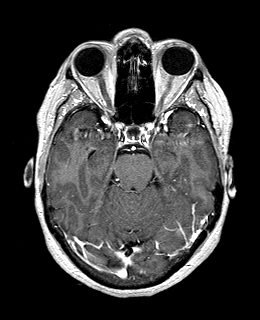
[im 64/144]
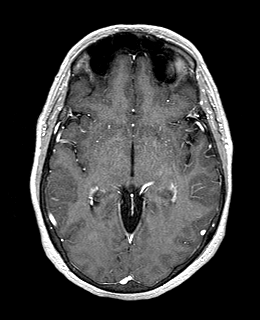
[im 80/144]
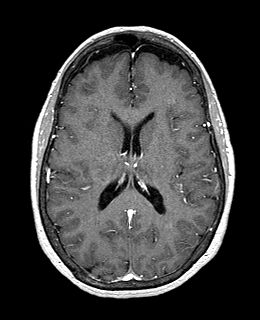
[im 96/144]
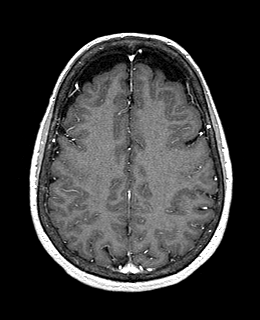
[im 112/144]
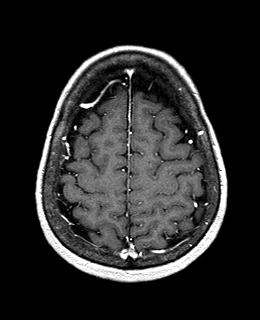
[im 128/144]
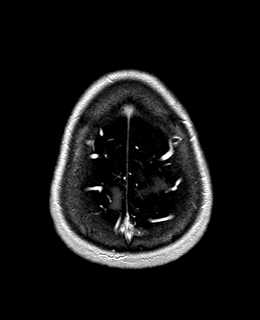
[im 144/144]
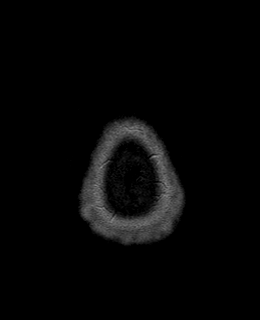

[Series 14: T1 post-contrast · coronal · 5.0mm · 0.72mm/px · 2 of 30 slices shown]
[im 1/30]
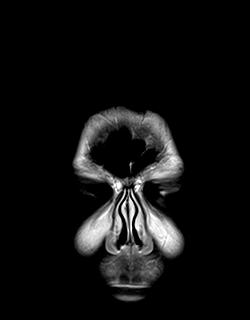
[im 30/30]
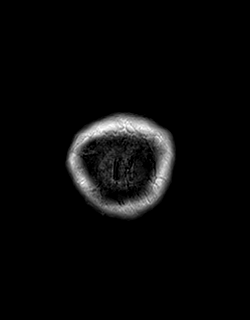

[48 of 48 positions shown; findings below may reference images not displayed]

FINDINGS: Brain:

There is no convincing evidence of acute infarct.

No evidence of intracranial mass.

No midline shift or extra-axial fluid collection.

No chronic intracranial blood products.

No focal parenchymal signal abnormality.

Cerebral volume is normal for age.

No abnormal intracranial enhancement is identified.

Vascular: Flow voids maintained within the proximal large arterial
vessels.

Skull and upper cervical spine: No focal marrow lesion.

Sinuses/Orbits: Visualized orbits demonstrate no acute abnormality.
Minimal ethmoid sinus mucosal thickening. No significant mastoid
effusion.
IMPRESSION: Normal MRI appearance of the brain for age. No evidence of
intracranial mass or acute intracranial abnormality.

## 2021-03-12 DIAGNOSIS — I10 Essential (primary) hypertension: Secondary | ICD-10-CM | POA: Diagnosis not present

## 2021-03-18 ENCOUNTER — Other Ambulatory Visit: Payer: Self-pay | Admitting: Internal Medicine

## 2021-06-03 ENCOUNTER — Encounter: Payer: Self-pay | Admitting: Internal Medicine

## 2021-06-03 ENCOUNTER — Ambulatory Visit (INDEPENDENT_AMBULATORY_CARE_PROVIDER_SITE_OTHER): Payer: BC Managed Care – PPO | Admitting: Internal Medicine

## 2021-06-03 VITALS — BP 138/82 | HR 103 | Ht 64.0 in | Wt 149.2 lb

## 2021-06-03 DIAGNOSIS — Z1231 Encounter for screening mammogram for malignant neoplasm of breast: Secondary | ICD-10-CM | POA: Diagnosis not present

## 2021-06-03 DIAGNOSIS — K50919 Crohn's disease, unspecified, with unspecified complications: Secondary | ICD-10-CM

## 2021-06-03 DIAGNOSIS — Z124 Encounter for screening for malignant neoplasm of cervix: Secondary | ICD-10-CM | POA: Diagnosis not present

## 2021-06-03 DIAGNOSIS — Z113 Encounter for screening for infections with a predominantly sexual mode of transmission: Secondary | ICD-10-CM | POA: Diagnosis not present

## 2021-06-03 DIAGNOSIS — Z6825 Body mass index (BMI) 25.0-25.9, adult: Secondary | ICD-10-CM | POA: Diagnosis not present

## 2021-06-03 DIAGNOSIS — Z01419 Encounter for gynecological examination (general) (routine) without abnormal findings: Secondary | ICD-10-CM | POA: Diagnosis not present

## 2021-06-03 MED ORDER — SULFASALAZINE 500 MG PO TABS: 2000.0000 mg | ORAL_TABLET | Freq: Two times a day (BID) | ORAL | 3 refills | Status: DC

## 2021-06-03 NOTE — Patient Instructions (Signed)
We have sent the following medications to your pharmacy for you to pick up at your convenience: Sulfasalazine  Follow-up 1 year. Sooner if needed.   If you are age 64 or younger, your body mass index should be between 19-25. Your Body mass index is 25.61 kg/m. If this is out of the aformentioned range listed, please consider follow up with your Primary Care Provider.   ________________________________________________________  The [facility] providers would like to encourage you to use [facility] to communicate with providers for non-urgent requests or questions.  Due to long hold times on the telephone, sending your provider a message by [facility] may be a faster and more efficient way to get a response.  Please allow 48 business hours for a response.  Please remember that this is for non-urgent requests.  _______________________________________________________  Thank you for choosing me and [facility].  Dr. Scarlette Shorts

## 2021-06-03 NOTE — Progress Notes (Signed)
HISTORY OF PRESENT ILLNESS:  Brittany [name] is a 65 y.o. female with a history of aortic stenosis status post aortic valve replacement and chronic Crohn's colitis.  She last underwent colonoscopy October 04, 2020 and was found to have patchy colitis throughout.  Laboratories were unremarkable.  She was changed from chronic sulfasalazine therapy to Lialda.  About 1 week later she developed abdominal cramping with loose stools, urgency, and bleeding.  Lialda was stopped.  Sulfasalazine resumed.  I saw her in the office November 19, 2020.  She was given a course of prednisone.  She has completed a course of steroids and has been doing well for 3 months or more since.  Currently describes regular bowel habits.  No diarrhea.  No constipation.  She does notice occasional bright red blood on the tissue with wiping.  Review of images from colonoscopy report does show internal hemorrhoids.  No new complaints  REVIEW OF SYSTEMS:  All non-GI ROS negative unless otherwise stated in the HPI.  Past Medical History:  Diagnosis Date   Anemia, unspecified    pt doesn't feel this is a recurrent issue for her   Aortic stenosis    Basal cell carcinoma     left ear Sept 2015   Colitis    Crohn's disease ([place])    Dyslipidemia    Heart murmur    Hypercholesterolemia    Hypertension    Unspecified    Past Surgical History:  Procedure Laterality Date   AORTIC VALVE REPLACEMENT  2008   CYST REMOVAL HAND Right 2017   right index finger    pylonidal cyst removed     TUBAL LIGATION      Social History LOUISIANA [name]  reports that she has never smoked. She has never used smokeless tobacco. She reports current alcohol use of about 3.0 standard drinks per week. She reports that she does not use drugs.  family history includes Alzheimer's disease in her maternal grandmother and mother; Breast cancer in her paternal aunt; Crohn's disease in her sister; Diabetes in her paternal grandfather; Headache in her mother;  Heart attack in her paternal grandfather; Heart disease in her paternal grandfather; Hypertension in her father; Pancreatic cancer in her maternal grandfather; Prostate cancer in her father.  No Known Allergies     PHYSICAL EXAMINATION: Vital signs: BP 138/82    Pulse (!) 103    Ht 5' 4"  (1.626 m)    Wt 149 lb 3.2 oz (67.7 kg)    LMP 07/29/2011    BMI 25.61 kg/m   Constitutional: generally well-appearing, no acute distress Psychiatric: alert and oriented x3, cooperative Eyes: extraocular movements intact, anicteric, conjunctiva pink Mouth: Mask Neck: supple no lymphadenopathy Cardiovascular: heart regular rate and rhythm. Lungs: clear to auscultation bilaterally Abdomen: soft, nontender, nondistended, no obvious ascites, no peritoneal signs, normal bowel sounds, no organomegaly Rectal: Omitted Extremities: no lower extremity edema bilaterally Skin: no lesions on visible extremities Neuro: No focal deficits. No asterixis.     ASSESSMENT:  1.  Chronic Crohn's colitis.  Active disease on most recent colonoscopy.  Seem to have flare of disease when changed from sulfasalazine to Lialda.  Responded to a course of prednisone.  Now asymptomatic and at baseline 2.  Aortic stenosis status post aortic valve replacement   PLAN:  1.  We will continue sulfasalazine 2 g twice daily.  1 year refills provided. 2.  Routine office follow-up 1 year.  Sooner if needed 3.  Plan relook colonoscopy around April  2024.  Sooner if needed

## 2021-06-30 ENCOUNTER — Other Ambulatory Visit: Payer: Self-pay | Admitting: Cardiovascular Disease

## 2021-07-21 DIAGNOSIS — D2262 Melanocytic nevi of left upper limb, including shoulder: Secondary | ICD-10-CM | POA: Diagnosis not present

## 2021-07-21 DIAGNOSIS — Z85828 Personal history of other malignant neoplasm of skin: Secondary | ICD-10-CM | POA: Diagnosis not present

## 2021-07-21 DIAGNOSIS — L821 Other seborrheic keratosis: Secondary | ICD-10-CM | POA: Diagnosis not present

## 2021-07-21 DIAGNOSIS — D1801 Hemangioma of skin and subcutaneous tissue: Secondary | ICD-10-CM | POA: Diagnosis not present

## 2021-08-27 DIAGNOSIS — M47812 Spondylosis without myelopathy or radiculopathy, cervical region: Secondary | ICD-10-CM | POA: Diagnosis not present

## 2021-08-27 DIAGNOSIS — E785 Hyperlipidemia, unspecified: Secondary | ICD-10-CM | POA: Diagnosis not present

## 2021-10-14 ENCOUNTER — Other Ambulatory Visit: Payer: Self-pay | Admitting: Internal Medicine

## 2021-12-17 ENCOUNTER — Other Ambulatory Visit: Payer: Self-pay | Admitting: Cardiovascular Disease

## 2022-02-11 ENCOUNTER — Other Ambulatory Visit: Payer: Self-pay | Admitting: Internal Medicine

## 2022-04-02 NOTE — Progress Notes (Signed)
Patient ID: NAMI STRAWDER, female   DOB: [birth year], 66 y.o.   MRN: [id number]     66 y.o. female  seen today in followup for her aortic valve replacement. Had bioprosthetic valve at young age . Her surgery was done in 2008. Observes SBE prophylaxis No chest pain , dyspnea, syncope palpitations or edema  TTE done 04/2016 EF 60-65% no AR mean gradient 11 mmHg peak 19 mmHg DVI .58  TTE done  05/01/19 peak velocity up to 2.8 with mean gradient 16 and peak 32 mmHG also mild MR and mild LVH   TTE done 10/03/20 mean gradient 16 peak 28 mmHg reviewed   No complaints Seeing dentist twice/year  She sees Dr Henrene Pastor for [place] and is maintained on  Azulfidine Last colonoscopy 10/04/20   No complaints thinking of retiring in a year    ROS: Denies fever, malais, weight loss, blurry vision, decreased visual acuity, cough, sputum, SOB, hemoptysis, pleuritic pain, palpitaitons, heartburn, abdominal pain, melena, lower extremity edema, claudication, or rash.  All other systems reviewed and negative  General: BP 134/78   Pulse 68   Ht 5' 4"  (1.626 m)   Wt 144 lb (65.3 kg)   LMP 07/29/2011   SpO2 96%   BMI 24.72 kg/m  Affect appropriate Healthy:  appears stated age 24: normal Neck supple with no adenopathy JVP normal no bruits no thyromegaly Lungs clear with no wheezing and good diaphragmatic motion Heart:  S1/S2 SEM through AVR no AR  murmur, no rub, gallop or click PMI normal post sternotomy  Abdomen: benighn, BS positve, no tenderness, no AAA no bruit.  No HSM or HJR Distal pulses intact with no bruits No edema Neuro non-focal Skin warm and dry No muscular weakness   Current Outpatient Medications  Medication Sig Dispense Refill   aspirin EC 81 MG tablet Take 81 mg by mouth daily.     CRESTOR 40 MG tablet Take 40 mg by mouth daily.   10   metoprolol tartrate (LOPRESSOR) 25 MG tablet TAKE 1 TABLET BY MOUTH TWICE DAILY 60 tablet 4   Multiple Vitamins-Minerals (CENTRUM)  tablet Take 1 tablet by mouth daily.     sulfaSALAzine (AZULFIDINE) 500 MG tablet TAKE 4 TABLETS(2000 MG) BY MOUTH TWICE DAILY 240 tablet 3   No current facility-administered medications for this visit.    Allergies  Patient has no known allergies.  Electrocardiogram:  04/07/2022  SR rate 68 LAE  Assessment and Plan  AVR: She has a 21 mm bovine tissue valve CE Model 3000 placed by [facility] July 7/ 2008  TTE stable gradients TTE 10/03/20 No need for SBE with colonoscopy  HLD: : on statin labs followed by [name]: stable no flares on azulfidine F/U Dr Henrene Pastor colonoscopy tomorrow HTN:  Continue Lopressor Borderline elevated during stress of tax season f/u [employer] When tax season is over her home readings will be fine   F/U with me in a year    [name]

## 2022-04-07 ENCOUNTER — Encounter: Payer: Self-pay | Admitting: Cardiovascular Disease

## 2022-04-07 ENCOUNTER — Ambulatory Visit: Payer: Medicare Other | Attending: Cardiovascular Disease | Admitting: Cardiovascular Disease

## 2022-04-07 VITALS — BP 134/78 | HR 68 | Ht 64.0 in | Wt 144.0 lb

## 2022-04-07 DIAGNOSIS — I10 Essential (primary) hypertension: Secondary | ICD-10-CM | POA: Insufficient documentation

## 2022-04-07 DIAGNOSIS — E785 Hyperlipidemia, unspecified: Secondary | ICD-10-CM | POA: Insufficient documentation

## 2022-04-07 DIAGNOSIS — Z952 Presence of prosthetic heart valve: Secondary | ICD-10-CM | POA: Insufficient documentation

## 2022-04-07 NOTE — Patient Instructions (Signed)
Medication Instructions:  Your physician recommends that you continue on your current medications as directed. Please refer to the Current Medication list given to you today.  *If you need a refill on your cardiac medications before your next appointment, please call your pharmacy*  Lab Work: If you have labs (blood work) drawn today and your tests are completely normal, you will receive your results only by: [place] (if you have MyChart) OR A paper copy in the mail If you have any lab test that is abnormal or we need to change your treatment, we will call you to review the results.  Testing/Procedures: None ordered today.  Follow-Up: At [facility], you and your health needs are our priority.  As part of our continuing mission to provide you with exceptional heart care, we have created designated Provider Care Teams.  These Care Teams include your primary Cardiologist (physician) and Advanced Practice Providers (APPs -  Physician Assistants and Nurse Practitioners) who all work together to provide you with the care you need, when you need it.  We recommend signing up for the patient portal called "MyChart".  Sign up information is provided on this After Visit Summary.  MyChart is used to connect with patients for Virtual Visits (Telemedicine).  Patients are able to view lab/test results, encounter notes, upcoming appointments, etc.  Non-urgent messages can be sent to your provider as well.   To learn more about what you can do with MyChart, go to [email or web address].    Your next appointment:   1 year(s)  The format for your next appointment:   In Person  Provider:   [name], MD     Important Information About Sugar

## 2022-05-18 ENCOUNTER — Other Ambulatory Visit: Payer: Self-pay | Admitting: Cardiovascular Disease

## 2022-06-19 ENCOUNTER — Other Ambulatory Visit: Payer: Self-pay | Admitting: Internal Medicine

## 2022-06-19 ENCOUNTER — Encounter: Payer: Self-pay | Admitting: Internal Medicine

## 2022-06-19 ENCOUNTER — Ambulatory Visit (INDEPENDENT_AMBULATORY_CARE_PROVIDER_SITE_OTHER): Payer: Medicare Other | Admitting: Internal Medicine

## 2022-06-19 VITALS — BP 110/76 | HR 69 | Ht 64.0 in | Wt 145.0 lb

## 2022-06-19 DIAGNOSIS — K50919 Crohn's disease, unspecified, with unspecified complications: Secondary | ICD-10-CM | POA: Diagnosis not present

## 2022-06-19 DIAGNOSIS — K625 Hemorrhage of anus and rectum: Secondary | ICD-10-CM

## 2022-06-19 MED ORDER — NA SULFATE-K SULFATE-MG SULF 17.5-3.13-1.6 GM/177ML PO SOLN: 1.0000 | Freq: Once | ORAL | 0 refills | Status: AC

## 2022-06-19 NOTE — Progress Notes (Signed)
HISTORY OF PRESENT ILLNESS:  Brittany [name] is a 67 y.o. female with a history of aortic stenosis status post aortic valve replacement and chronic Crohn's colitis for which she is followed in this office.  She last underwent colonoscopy October 04, 2020 and was found to have patchy colitis throughout.  Laboratories were unremarkable.  She had been on chronic sulfasalazine therapy.  This was changed to [place].  However, about 1 week after initiating Lialda therapy she developed abdominal cramping with loose stools, urgency, and bleeding.  The that was stopped and sulfasalazine resumed.  She was seen in follow-up thereafter and required a course of prednisone.  Her most recent follow-up in December 2022, she was doing well with no significant complaints.  She would notice occasional bright red blood on the tissue only with wiping.  She presents today for follow-up.  Her bowel habits tend to fluctuate between constipation and loose.  Constipation predominance.  She tells me that she is concerned about increased frequency of bleeding.  She has noticed this with bowel movements.  No abdominal pain.  Weight has been stable.  Has been compliant with sulfasalazine therapy at 2 g daily.  Sometimes difficult to find this at certain pharmacies.  However, she has not had interruption of therapy.  She gets regular blood work through her primary care provider's office.  Does not report any significant abnormalities.  REVIEW OF SYSTEMS:  All non-GI ROS negative unless otherwise stated in the HPI except for headaches, sleeping problems  Past Medical History:  Diagnosis Date   Anemia, unspecified    pt doesn't feel this is a recurrent issue for her   Aortic stenosis    Basal cell carcinoma     left ear Sept 2015   Colitis    Crohn's disease ([place])    Dyslipidemia    Heart murmur    Hypercholesterolemia    Hypertension    Unspecified    Past Surgical History:  Procedure Laterality Date   AORTIC VALVE  REPLACEMENT  2008   CYST REMOVAL HAND Right 2017   right index finger    pylonidal cyst removed     TUBAL LIGATION      Social History Brittany [name]  reports that she has never smoked. She has never used smokeless tobacco. She reports current alcohol use of about 3.0 standard drinks of alcohol per week. She reports that she does not use drugs.  family history includes Alzheimer's disease in her maternal grandmother and mother; Breast cancer in her paternal aunt; Crohn's disease in her sister; Diabetes in her paternal grandfather; Headache in her mother; Heart attack in her paternal grandfather; Heart disease in her paternal grandfather; Hypertension in her father; Pancreatic cancer in her maternal grandfather; Prostate cancer in her father.  No Known Allergies     PHYSICAL EXAMINATION: Vital signs: BP 110/76   Pulse 69   Ht '5\' 4"'$  (1.626 m)   Wt 145 lb (65.8 kg)   LMP 07/29/2011   BMI 24.89 kg/m   Constitutional: generally well-appearing, no acute distress Psychiatric: alert and oriented x3, cooperative Eyes: extraocular movements intact, anicteric, conjunctiva pink Mouth: oral pharynx moist, no lesions Neck: supple no lymphadenopathy Cardiovascular: heart regular rate and rhythm, no murmur Lungs: clear to auscultation bilaterally Abdomen: soft, nontender, nondistended, no obvious ascites, no peritoneal signs, normal bowel sounds, no organomegaly Rectal: Deferred to colonoscopy Extremities: no, cyanosis, or lower extremity edema bilaterally Skin: no lesions on visible extremities Neuro: No focal deficits.  Cranial  nerves intact  ASSESSMENT:  1.  Chronic Crohn's colitis.  Longstanding.  Most recent colonoscopy with active disease as described.  Was changed from sulfasalazine to Lialda but appears to worsen.  Responded to a short course of steroids.  Overall stable, but now with increased frequency of rectal bleeding, which concerns her.  She happens to be due for her  surveillance colonoscopy at this time 2.  Aortic stenosis status post aortic valve replacement.   PLAN:  1.  Continue sulfasalazine.  Refilled. 2.  Schedule surveillance colonoscopy with biopsies.The nature of the procedure, as well as the risks, benefits, and alternatives were carefully and thoroughly reviewed with the patient. Ample time for discussion and questions allowed. The patient understood, was satisfied, and agreed to proceed. 3.  Further recommendations or adjustments to medical therapy to be determined based on the results of upcoming colonoscopy.

## 2022-06-19 NOTE — Patient Instructions (Signed)
_______________________________________________________  If you are age 66 or older, your body mass index should be between 23-30. Your Body mass index is 24.89 kg/m. If this is out of the aforementioned range listed, please consider follow up with your Primary Care Provider.  If you are age 50 or younger, your body mass index should be between 19-25. Your Body mass index is 24.89 kg/m. If this is out of the aformentioned range listed, please consider follow up with your Primary Care Provider.   ________________________________________________________  The [facility] providers would like to encourage you to use [facility] to communicate with providers for non-urgent requests or questions.  Due to long hold times on the telephone, sending your provider a message by [facility] may be a faster and more efficient way to get a response.  Please allow 48 business hours for a response.  Please remember that this is for non-urgent requests.  _______________________________________________________  Dennis Bast have been scheduled for a colonoscopy. Please follow written instructions given to you at your visit today.  Please pick up your prep supplies at the pharmacy within the next 1-3 days. If you use inhalers (even only as needed), please bring them with you on the day of your procedure.

## 2022-07-28 ENCOUNTER — Ambulatory Visit (AMBULATORY_SURGERY_CENTER): Payer: Medicare Other | Admitting: Internal Medicine

## 2022-07-28 ENCOUNTER — Encounter: Payer: Self-pay | Admitting: Internal Medicine

## 2022-07-28 VITALS — BP 110/71 | HR 67 | Temp 97.7°F | Resp 12 | Ht 64.0 in | Wt 145.0 lb

## 2022-07-28 DIAGNOSIS — K5140 Inflammatory polyps of colon without complications: Secondary | ICD-10-CM | POA: Diagnosis not present

## 2022-07-28 DIAGNOSIS — K529 Noninfective gastroenteritis and colitis, unspecified: Secondary | ICD-10-CM | POA: Diagnosis not present

## 2022-07-28 DIAGNOSIS — K50919 Crohn's disease, unspecified, with unspecified complications: Secondary | ICD-10-CM

## 2022-07-28 MED ORDER — SODIUM CHLORIDE 0.9 % IV SOLN: 500.0000 mL | INTRAVENOUS | Status: DC

## 2022-07-28 NOTE — Progress Notes (Signed)
HISTORY OF PRESENT ILLNESS:   Brittany [name] is a 67 y.o. female with a history of aortic stenosis status post aortic valve replacement and chronic Crohn's colitis for which she is followed in this office.  She last underwent colonoscopy October 04, 2020 and was found to have patchy colitis throughout.  Laboratories were unremarkable.  She had been on chronic sulfasalazine therapy.  This was changed to [place].  However, about 1 week after initiating Lialda therapy she developed abdominal cramping with loose stools, urgency, and bleeding.  The that was stopped and sulfasalazine resumed.  She was seen in follow-up thereafter and required a course of prednisone.  Her most recent follow-up in December 2022, she was doing well with no significant complaints.  She would notice occasional bright red blood on the tissue only with wiping.  She presents today for follow-up.   Her bowel habits tend to fluctuate between constipation and loose.  Constipation predominance.  She tells me that she is concerned about increased frequency of bleeding.  She has noticed this with bowel movements.  No abdominal pain.  Weight has been stable.  Has been compliant with sulfasalazine therapy at 2 g daily.  Sometimes difficult to find this at certain pharmacies.  However, she has not had interruption of therapy.  She gets regular blood work through her primary care provider's office.  Does not report any significant abnormalities.   REVIEW OF SYSTEMS:   All non-GI ROS negative unless otherwise stated in the HPI except for headaches, sleeping problems       Past Medical History:  Diagnosis Date   Anemia, unspecified      pt doesn't feel this is a recurrent issue for her   Aortic stenosis     Basal cell carcinoma       left ear Sept 2015   Colitis     Crohn's disease ([place])     Dyslipidemia     Heart murmur     Hypercholesterolemia     Hypertension      Unspecified           Past Surgical History:  Procedure  Laterality Date   AORTIC VALVE REPLACEMENT   2008   CYST REMOVAL HAND Right 2017    right index finger    pylonidal cyst removed       TUBAL LIGATION          Social History Brittany [name]  reports that she has never smoked. She has never used smokeless tobacco. She reports current alcohol use of about 3.0 standard drinks of alcohol per week. She reports that she does not use drugs.   family history includes Alzheimer's disease in her maternal grandmother and mother; Breast cancer in her paternal aunt; Crohn's disease in her sister; Diabetes in her paternal grandfather; Headache in her mother; Heart attack in her paternal grandfather; Heart disease in her paternal grandfather; Hypertension in her father; Pancreatic cancer in her maternal grandfather; Prostate cancer in her father.   No Known Allergies       PHYSICAL EXAMINATION: Vital signs: BP 110/76   Pulse 69   Ht 5' 4"$  (1.626 m)   Wt 145 lb (65.8 kg)   LMP 07/29/2011   BMI 24.89 kg/m   Constitutional: generally well-appearing, no acute distress Psychiatric: alert and oriented x3, cooperative Eyes: extraocular movements intact, anicteric, conjunctiva pink Mouth: oral pharynx moist, no lesions Neck: supple no lymphadenopathy Cardiovascular: heart regular rate and rhythm, no murmur Lungs: clear to auscultation  bilaterally Abdomen: soft, nontender, nondistended, no obvious ascites, no peritoneal signs, normal bowel sounds, no organomegaly Rectal: Deferred to colonoscopy Extremities: no, cyanosis, or lower extremity edema bilaterally Skin: no lesions on visible extremities Neuro: No focal deficits.  Cranial nerves intact   ASSESSMENT:   1.  Chronic Crohn's colitis.  Longstanding.  Most recent colonoscopy with active disease as described.  Was changed from sulfasalazine to Lialda but appears to worsen.  Responded to a short course of steroids.  Overall stable, but now with increased frequency of rectal bleeding, which concerns  her.  She happens to be due for her surveillance colonoscopy at this time 2.  Aortic stenosis status post aortic valve replacement.     PLAN:   1.  Continue sulfasalazine.  Refilled. 2.  Schedule surveillance colonoscopy with biopsies.The nature of the procedure, as well as the risks, benefits, and alternatives were carefully and thoroughly reviewed with the patient. Ample time for discussion and questions allowed. The patient understood, was satisfied, and agreed to proceed. 3.  Further recommendations or adjustments to medical therapy to be determined based on the results of upcoming colonoscopy.   No interval change in history or physical exam.  Now for colonoscopy

## 2022-07-28 NOTE — Op Note (Signed)
[facility] Patient Name: Brittany [name] Procedure Date: 07/28/2022 9:10 AM MRN: [id number] Endoscopist: Docia Chuck. Henrene Pastor , MD, [id number] Age: 67 Referring MD:  Date of Birth: [birth year] Gender: Female Account #: [id number] Procedure:                Colonoscopy with biopsies Indications:              High risk colon cancer surveillance: Inflammatory                            bowel disease (unclassified) of 8 (or more) years                            duration with one-third (or more) of the colon                            involved Medicines:                Monitored Anesthesia Care Procedure:                Pre-Anesthesia Assessment:                           - Prior to the procedure, a History and Physical                            was performed, and patient medications and                            allergies were reviewed. The patient's tolerance of                            previous anesthesia was also reviewed. The risks                            and benefits of the procedure and the sedation                            options and risks were discussed with the patient.                            All questions were answered, and informed consent                            was obtained. Prior Anticoagulants: The patient has                            taken no anticoagulant or antiplatelet agents. ASA                            Grade Assessment: II - A patient with mild systemic                            disease. After reviewing the risks and benefits,  the patient was deemed in satisfactory condition to                            undergo the procedure.                           After obtaining informed consent, the colonoscope                            was passed under direct vision. Throughout the                            procedure, the patient's blood pressure, pulse, and                            oxygen saturations were monitored  continuously. The                            Olympus CF-[id number]) Colonoscope was                            introduced through the anus and advanced to the the                            cecum, identified by appendiceal orifice and                            ileocecal valve. The ileocecal valve, appendiceal                            orifice, and rectum were photographed. The quality                            of the bowel preparation was excellent. The                            colonoscopy was performed without difficulty. The                            patient tolerated the procedure well. The bowel                            preparation used was SUPREP via split dose                            instruction. Scope In: 9:29:34 AM Scope Out: 9:56:40 AM Scope Withdrawal Time: 0 hours 13 minutes 58 seconds  Total Procedure Duration: 0 hours 27 minutes 6 seconds  Findings:                 There was patchy colitis involving the cecum and                            ascending colon most prominently. Less dramatic  patchy colitis involving the sigmoid and descending                            colon. Transverse colon without significant                            inflammation. No obvious polyps or mass.                            Hemorrhoids noted on retroflexed view. Biopsies                            were taken with a cold forceps for histology from                            multiple anatomic locations including cecum,                            ascending colon, transverse colon, descending                            colon, and sigmoid colon. Complications:            No immediate complications. Estimated blood loss:                            None. Estimated Blood Loss:     Estimated blood loss: none. Impression:               - Patchy colitis as described status post biopsies. Recommendation:           - Repeat colonoscopy in 2 years for surveillance.                            - Patient has a contact number available for                            emergencies. The signs and symptoms of potential                            delayed complications were discussed with the                            patient. Return to normal activities tomorrow.                            Written discharge instructions were provided to the                            patient.                           - Resume previous diet.                           - Continue present medications.                           -  Await pathology results. Further plans pending                            pathology results Docia Chuck. Henrene Pastor, MD 07/28/2022 10:07:13 AM This report has been signed electronically.

## 2022-07-28 NOTE — Progress Notes (Signed)
Patient reports no changes to health or medications since office visit

## 2022-07-28 NOTE — Progress Notes (Signed)
Called to room to assist during endoscopic procedure.  Patient ID and intended procedure confirmed with present staff. Received instructions for my participation in the procedure from the performing physician.  

## 2022-07-28 NOTE — Patient Instructions (Signed)
YOU HAD AN ENDOSCOPIC PROCEDURE TODAY AT [facility]:   Refer to the procedure report that was given to you for any specific questions about what was found during the examination.  If the procedure report does not answer your questions, please call your gastroenterologist to clarify.  If you requested that your care partner not be given the details of your procedure findings, then the procedure report has been included in a sealed envelope for you to review at your convenience later.  **Handout given on hemorrhoids**  YOU SHOULD EXPECT: Some feelings of bloating in the abdomen. Passage of more gas than usual.  Walking can help get rid of the air that was put into your GI tract during the procedure and reduce the bloating. If you had a lower endoscopy (such as a colonoscopy or flexible sigmoidoscopy) you may notice spotting of blood in your stool or on the toilet paper. If you underwent a bowel prep for your procedure, you may not have a normal bowel movement for a few days.  Please Note:  You might notice some irritation and congestion in your nose or some drainage.  This is from the oxygen used during your procedure.  There is no need for concern and it should clear up in a day or so.  SYMPTOMS TO REPORT IMMEDIATELY:  Following lower endoscopy (colonoscopy or flexible sigmoidoscopy):  Excessive amounts of blood in the stool  Significant tenderness or worsening of abdominal pains  Swelling of the abdomen that is new, acute  Fever of 100F or higher  For urgent or emergent issues, a gastroenterologist can be reached at any hour by calling [phone]. Do not use MyChart messaging for urgent concerns.    DIET:  We do recommend a small meal at first, but then you may proceed to your regular diet.  Drink plenty of fluids but you should avoid alcoholic beverages for 24 hours.  ACTIVITY:  You should plan to take it easy for the rest of today and you should NOT DRIVE or use heavy  machinery until tomorrow (because of the sedation medicines used during the test).    FOLLOW UP: Our staff will call the number listed on your records the next business day following your procedure.  We will call around 7:15- 8:00 am to check on you and address any questions or concerns that you may have regarding the information given to you following your procedure. If we do not reach you, we will leave a message.     If any biopsies were taken you will be contacted by phone or by letter within the next 1-3 weeks.  Please call us at [phone] if you have not heard about the biopsies in 3 weeks.    SIGNATURES/CONFIDENTIALITY: You and/or your care partner have signed paperwork which will be entered into your electronic medical record.  These signatures attest to the fact that that the information above on your After Visit Summary has been reviewed and is understood.  Full responsibility of the confidentiality of this discharge information lies with you and/or your care-partner.

## 2022-07-28 NOTE — Progress Notes (Signed)
Vss nad trans to pacu °

## 2022-07-29 ENCOUNTER — Telehealth: Payer: Self-pay | Admitting: *Deleted

## 2022-07-29 NOTE — Telephone Encounter (Signed)
  Follow up Call-     07/28/2022    8:35 AM 10/04/2020    8:29 AM  Call back number  Post procedure Call Back phone  # [phone]  Permission to leave phone message Yes Yes     Patient questions:  Do you have a fever, pain , or abdominal swelling? No. Pain Score  0 *  Have you tolerated food without any problems? Yes.    Have you been able to return to your normal activities? Yes.    Do you have any questions about your discharge instructions: Diet   No. Medications  No. Follow up visit  No.  Do you have questions or concerns about your Care? No.  Actions: * If pain score is 4 or above: No action needed, pain <4.

## 2022-08-07 ENCOUNTER — Encounter: Payer: Self-pay | Admitting: Internal Medicine

## 2022-10-23 ENCOUNTER — Other Ambulatory Visit: Payer: Self-pay | Admitting: Internal Medicine

## 2023-03-01 ENCOUNTER — Other Ambulatory Visit: Payer: Self-pay | Admitting: Internal Medicine

## 2023-03-25 NOTE — Progress Notes (Signed)
Patient ID: Brittany [name], female   DOB: 1955-10-29, 67 y.o.   MRN: [id number]     67 y.o. female  seen today in followup for her aortic valve replacement. Had bioprosthetic valve at young age . Her surgery was done in 2008. Observes SBE prophylaxis No chest pain , dyspnea, syncope palpitations or edema  TTE done 04/2016 EF 60-65% no AR mean gradient 11 mmHg peak 19 mmHg DVI .58  TTE done  05/01/19 peak velocity up to 2.8 with mean gradient 16 and peak 32 mmHG also mild MR and mild LVH   TTE done 10/03/20 mean gradient 16 peak 28 mmHg reviewed   Seeing dentist twice/year Has some macular issues on left eye Needs surgery next Tuesday She sees Dr Brittany [name] for Chrohns  Disease and is maintained on  Azulfidine Last colonoscopy 10/04/20   No complaints thinking of retiring in a year    ROS: Denies fever, malais, weight loss, blurry vision, decreased visual acuity, cough, sputum, SOB, hemoptysis, pleuritic pain, palpitaitons, heartburn, abdominal pain, melena, lower extremity edema, claudication, or rash.  All other systems reviewed and negative  General: BP 132/88   Pulse 65   Ht 5\' 4"  (1.626 m)   Wt 147 lb (66.7 kg)   LMP 07/29/2011   SpO2 98%   BMI 25.23 kg/m  Affect appropriate Healthy:  appears stated age HEENT: normal Neck supple with no adenopathy JVP normal no bruits no thyromegaly Lungs clear with no wheezing and good diaphragmatic motion Heart:  S1/S2 SEM through AVR no AR  murmur, no rub, gallop or click PMI normal post sternotomy  Abdomen: benighn, BS positve, no tenderness, no AAA no bruit.  No HSM or HJR Distal pulses intact with no bruits No edema Neuro non-focal Skin warm and dry No muscular weakness   Current Outpatient Medications  Medication Sig Dispense Refill   aspirin EC 81 MG tablet Take 81 mg by mouth daily.     CRESTOR 40 MG tablet Take 40 mg by mouth daily.   10   metoprolol tartrate (LOPRESSOR) 25 MG tablet TAKE 1 TABLET BY MOUTH TWICE DAILY 180  tablet 3   Multiple Vitamins-Minerals (CENTRUM) tablet Take 1 tablet by mouth daily.     sulfaSALAzine (AZULFIDINE) 500 MG tablet TAKE 4 TABLETS(2000 MG) BY MOUTH TWICE DAILY 240 tablet 3   No current facility-administered medications for this visit.    Allergies  Patient has no known allergies.  Electrocardiogram:  04/06/2023  SR rate 68 LAE  Assessment and Plan  AVR: She has a 21 mm bovine tissue valve CE Model 3000 placed by [name] July 7/ 2008  TTE stable gradients TTE 10/03/20 No need for SBE with colonoscopy Update TTE HLD: : on statin labs followed by Brittany [name]: stable no flares on Lialda  for a while now back on Sulfasalazine F/U Dr Brittany [name]  Colonoscopy 09/2020 mild patchy colitis throughout  HTN:  Continue Lopressor Borderline elevated during stress of tax season   Vision:  has macular "hole" in left eye for surgery Tuesday    TTE for AVR    F/U with me in a year    [employer]

## 2023-04-06 ENCOUNTER — Ambulatory Visit: Payer: Medicare Other | Attending: Cardiovascular Disease | Admitting: Cardiovascular Disease

## 2023-04-06 ENCOUNTER — Encounter: Payer: Self-pay | Admitting: Cardiovascular Disease

## 2023-04-06 VITALS — BP 132/88 | HR 65 | Ht 64.0 in | Wt 147.0 lb

## 2023-04-06 DIAGNOSIS — I10 Essential (primary) hypertension: Secondary | ICD-10-CM | POA: Diagnosis not present

## 2023-04-06 DIAGNOSIS — Z952 Presence of prosthetic heart valve: Secondary | ICD-10-CM | POA: Insufficient documentation

## 2023-04-06 NOTE — Patient Instructions (Signed)
Medication Instructions:  Your physician recommends that you continue on your current medications as directed. Please refer to the Current Medication list given to you today.  *If you need a refill on your cardiac medications before your next appointment, please call your pharmacy*  Lab Work: If you have labs (blood work) drawn today and your tests are completely normal, you will receive your results only by: MyChart Message (if you have MyChart) OR A paper copy in the mail If you have any lab test that is abnormal or we need to change your treatment, we will call you to review the results.  Testing/Procedures: Your physician has requested that you have an echocardiogram. Echocardiography is a painless test that uses sound waves to create images of your heart. It provides your doctor with information about the size and shape of your heart and how well your heart's chambers and valves are working. This procedure takes approximately one hour. There are no restrictions for this procedure. Please do NOT wear cologne, perfume, aftershave, or lotions (deodorant is allowed). Please arrive 15 minutes prior to your appointment time.   Follow-Up: At [facility], you and your health needs are our priority.  As part of our continuing mission to provide you with exceptional heart care, we have created designated Provider Care Teams.  These Care Teams include your primary Cardiologist (physician) and Advanced Practice Providers (APPs -  Physician Assistants and Nurse Practitioners) who all work together to provide you with the care you need, when you need it.  We recommend signing up for the patient portal called "MyChart".  Sign up information is provided on this After Visit Summary.  MyChart is used to connect with patients for Virtual Visits (Telemedicine).  Patients are able to view lab/test results, encounter notes, upcoming appointments, etc.  Non-urgent messages can be sent to your provider as  well.   To learn more about what you can do with MyChart, go to [email or web address].    Your next appointment:   1 year(s)  Provider:   Peter Nishan, MD     

## 2023-05-20 ENCOUNTER — Ambulatory Visit (HOSPITAL_COMMUNITY): Payer: Medicare Other | Attending: Cardiovascular Disease

## 2023-05-20 ENCOUNTER — Telehealth: Payer: Self-pay

## 2023-05-20 DIAGNOSIS — Z952 Presence of prosthetic heart valve: Secondary | ICD-10-CM | POA: Insufficient documentation

## 2023-05-20 LAB — ECHOCARDIOGRAM COMPLETE
AR max vel: 1.19 cm2
AV Area VTI: 1.27 cm2
AV Area mean vel: 1.2 cm2
AV Mean grad: 19.3 mm[Hg]
AV Peak grad: 35.8 mm[Hg]
Ao pk vel: 2.99 m/s
Area-P 1/2: 3.99 cm2
S' Lateral: 2.1 cm

## 2023-05-20 NOTE — Telephone Encounter (Signed)
-----   Message from Charlton Haws sent at 05/20/2023  3:23 PM EST ----- Overall AVR ok gradient up a bit but nothing to worry about now f/u echo in a year

## 2023-05-20 NOTE — Telephone Encounter (Signed)
Ordered echo for 1 year.

## 2023-05-26 ENCOUNTER — Other Ambulatory Visit: Payer: Self-pay | Admitting: Cardiovascular Disease

## 2023-06-21 ENCOUNTER — Other Ambulatory Visit: Payer: Self-pay | Admitting: Internal Medicine

## 2023-10-11 ENCOUNTER — Other Ambulatory Visit: Payer: Self-pay | Admitting: Internal Medicine

## 2023-11-08 ENCOUNTER — Other Ambulatory Visit: Payer: Self-pay | Admitting: Internal Medicine

## 2023-12-24 ENCOUNTER — Other Ambulatory Visit: Payer: Self-pay | Admitting: Internal Medicine

## 2024-01-26 ENCOUNTER — Ambulatory Visit: Admitting: Internal Medicine

## 2024-01-26 ENCOUNTER — Encounter: Payer: Self-pay | Admitting: Internal Medicine

## 2024-01-26 VITALS — BP 124/80 | HR 86 | Ht 64.0 in | Wt 142.0 lb

## 2024-01-26 DIAGNOSIS — K5090 Crohn's disease, unspecified, without complications: Secondary | ICD-10-CM

## 2024-01-26 DIAGNOSIS — K50919 Crohn's disease, unspecified, with unspecified complications: Secondary | ICD-10-CM

## 2024-01-26 MED ORDER — SULFASALAZINE 500 MG PO TABS: 2000.0000 mg | ORAL_TABLET | Freq: Two times a day (BID) | ORAL | 3 refills | Status: AC

## 2024-01-26 NOTE — Patient Instructions (Signed)
 We have sent the following medications to your pharmacy for you to pick up at your convenience:  Sulfasalazine .  Please follow up in one year.  _______________________________________________________  If your blood pressure at your visit was 140/90 or greater, please contact your primary care physician to follow up on this.  _______________________________________________________  If you are age 67 or older, your body mass index should be between 23-30. Your Body mass index is 24.37 kg/m. If this is out of the aforementioned range listed, please consider follow up with your Primary Care Provider.  If you are age 12 or younger, your body mass index should be between 19-25. Your Body mass index is 24.37 kg/m. If this is out of the aformentioned range listed, please consider follow up with your Primary Care Provider.   ________________________________________________________  The [facility] providers would like to encourage you to use MYCHART to communicate with providers for non-urgent requests or questions.  Due to long hold times on the telephone, sending your provider a message by [facility] may be a faster and more efficient way to get a response.  Please allow 48 business hours for a response.  Please remember that this is for non-urgent requests.  _______________________________________________________  [facility] is using a team-based approach to care.  Your team is made up of your doctor and two to three APPS. Our APPS (Nurse Practitioners and Physician Assistants) work with your physician to ensure care continuity for you. They are fully qualified to address your health concerns and develop a treatment plan. They communicate directly with your gastroenterologist to care for you. Seeing the Advanced Practice Practitioners on your physician's team can help you by facilitating care more promptly, often allowing for earlier appointments, access to diagnostic testing, procedures,  and other specialty referrals.

## 2024-01-26 NOTE — Progress Notes (Signed)
 HISTORY OF PRESENT ILLNESS:  Brittany [name] is a 68 y.o. female with a history of aortic stenosis status post aortic valve replacement and chronic Crohn's colitis for which she is followed in this office.  She last underwent colonoscopy July 28, 2022 and was found to have patchy colitis throughout, though most prominent in the right colon.  Biopsies revealed mild chronic active colitis without dysplasia.  She remains on chronic sulfasalazine  therapy, 2 g twice daily.  At 1 point, she was changed to Lialda  but paradoxically worsened.  She presents today for routine follow-up.   Her bowel habits are unchanged and tend to fluctuate between constipation and loose.  Constipation predominance.  No significant bleeding.   No abdominal pain.  Weight has been stable.  Has been compliant with sulfasalazine  therapy at 4 g daily.  GI review of systems is otherwise negative.  She request medication refill  REVIEW OF SYSTEMS:  All non-GI ROS negative. Past Medical History:  Diagnosis Date   Anemia, unspecified    pt doesn't feel this is a recurrent issue for her   Aortic stenosis    Basal cell carcinoma     left ear Sept 2015   Colitis    Crohn's disease (HCC)    Dyslipidemia    Heart murmur    Hypercholesterolemia    Hypertension    Unspecified    Past Surgical History:  Procedure Laterality Date   AORTIC VALVE REPLACEMENT  2008   CYST REMOVAL HAND Right 2017   right index finger    pylonidal cyst removed     TUBAL LIGATION      Social History Brittany [name]  reports that she has never smoked. She has never used smokeless tobacco. She reports current alcohol  use of about 3.0 standard drinks of alcohol  per week. She reports that she does not use drugs.  family history includes Alzheimer's disease in her maternal grandmother and mother; Breast cancer in her paternal aunt; Crohn's disease in her sister; Diabetes in her paternal grandfather; Headache in her mother; Heart attack in her  paternal grandfather; Heart disease in her paternal grandfather; Hypertension in her father; Pancreatic cancer in her maternal grandfather; Prostate cancer in her father.  No Known Allergies     PHYSICAL EXAMINATION: Vital signs: BP 124/80   Pulse 86   Ht 5' 4 (1.626 m)   Wt 142 lb (64.4 kg)   LMP 07/29/2011   BMI 24.37 kg/m  General: Well-developed, well-nourished, no acute distress HEENT: Sclerae are anicteric, conjunctiva pink. Oral mucosa intact Lungs: Clear Heart: Regular with mechanical heart sounds Abdomen: soft, nontender, nondistended, no obvious ascites, no peritoneal signs, normal bowel sounds. No organomegaly. Extremities: No clubbing, cyanosis, or edema Psychiatric: alert and oriented x3. Cooperative   ASSESSMENT:   1.  Chronic Crohn's colitis.  Longstanding.  Most recent colonoscopy with active disease as described.  She remains on sulfasalazine  4 g daily. 2.  Intolerant to the elbow 3.  Aortic stenosis status post aortic valve replacement.     PLAN:   1.  Continue sulfasalazine .  Refilled. 2.  Due for routine surveillance colonoscopy next year.  She is aware. 3.  Ongoing general medical care with Dr. Shepard

## 2024-02-13 ENCOUNTER — Other Ambulatory Visit: Payer: Self-pay | Admitting: Cardiovascular Disease

## 2024-02-18 ENCOUNTER — Other Ambulatory Visit: Payer: Self-pay | Admitting: Cardiovascular Disease

## 2024-05-22 ENCOUNTER — Ambulatory Visit (HOSPITAL_COMMUNITY)
Admission: RE | Admit: 2024-05-22 | Discharge: 2024-05-22 | Disposition: A | Source: Ambulatory Visit | Attending: Cardiovascular Disease | Admitting: Cardiovascular Disease

## 2024-05-22 DIAGNOSIS — Z952 Presence of prosthetic heart valve: Secondary | ICD-10-CM

## 2024-05-22 LAB — ECHOCARDIOGRAM COMPLETE
AR max vel: 0.88 cm2
AV Area VTI: 0.89 cm2
AV Area mean vel: 0.88 cm2
AV Mean grad: 20.5 mmHg
AV Peak grad: 39.4 mmHg
Ao pk vel: 3.14 m/s
Area-P 1/2: 2.94 cm2
P 1/2 time: 413 ms
S' Lateral: 2.5 cm

## 2024-05-23 ENCOUNTER — Ambulatory Visit: Payer: Self-pay | Admitting: Cardiovascular Disease

## 2024-05-24 ENCOUNTER — Other Ambulatory Visit: Payer: Self-pay | Admitting: Cardiovascular Disease

## 2024-10-05 ENCOUNTER — Ambulatory Visit: Admitting: Cardiovascular Disease
